# Patient Record
Sex: Male | Born: 1945 | Race: White | Hispanic: No | Smoking: Never smoker
Health system: Southern US, Community
[De-identification: ages and names within clinical notes are randomized; demographics above are authoritative.]

## PROBLEM LIST (undated history)

## (undated) DIAGNOSIS — R918 Other nonspecific abnormal finding of lung field: Secondary | ICD-10-CM

## (undated) DIAGNOSIS — R55 Syncope and collapse: Secondary | ICD-10-CM

## (undated) DIAGNOSIS — E039 Hypothyroidism, unspecified: Secondary | ICD-10-CM

## (undated) DIAGNOSIS — I509 Heart failure, unspecified: Secondary | ICD-10-CM

## (undated) DIAGNOSIS — G4720 Circadian rhythm sleep disorder, unspecified type: Secondary | ICD-10-CM

## (undated) DIAGNOSIS — M1990 Unspecified osteoarthritis, unspecified site: Secondary | ICD-10-CM

## (undated) DIAGNOSIS — G5601 Carpal tunnel syndrome, right upper limb: Secondary | ICD-10-CM

## (undated) DIAGNOSIS — G5620 Lesion of ulnar nerve, unspecified upper limb: Secondary | ICD-10-CM

## (undated) DIAGNOSIS — E785 Hyperlipidemia, unspecified: Secondary | ICD-10-CM

## (undated) DIAGNOSIS — K5790 Diverticulosis of intestine, part unspecified, without perforation or abscess without bleeding: Secondary | ICD-10-CM

## (undated) DIAGNOSIS — T7840XA Allergy, unspecified, initial encounter: Secondary | ICD-10-CM

## (undated) DIAGNOSIS — M4300 Spondylolysis, site unspecified: Secondary | ICD-10-CM

## (undated) DIAGNOSIS — C801 Malignant (primary) neoplasm, unspecified: Secondary | ICD-10-CM

## (undated) HISTORY — DX: Other nonspecific abnormal finding of lung field: R91.8

## (undated) HISTORY — DX: Hyperlipidemia, unspecified: E78.5

## (undated) HISTORY — DX: Diverticulosis of intestine, part unspecified, without perforation or abscess without bleeding: K57.90

## (undated) HISTORY — DX: Spondylolysis, site unspecified: M43.00

## (undated) HISTORY — DX: Lesion of ulnar nerve, unspecified upper limb: G56.20

## (undated) HISTORY — DX: Allergy, unspecified, initial encounter: T78.40XA

## (undated) HISTORY — DX: Malignant (primary) neoplasm, unspecified: C80.1

## (undated) HISTORY — DX: Hypothyroidism, unspecified: E03.9

## (undated) HISTORY — PX: UMBILICAL HERNIA REPAIR: SHX196

## (undated) HISTORY — DX: Heart failure, unspecified: I50.9

## (undated) HISTORY — DX: Circadian rhythm sleep disorder, unspecified type: G47.20

## (undated) HISTORY — DX: Carpal tunnel syndrome, right upper limb: G56.01

## (undated) HISTORY — PX: CARDIAC ELECTROPHYSIOLOGY STUDY AND ABLATION: SHX1294

## (undated) HISTORY — PX: KNEE SURGERY: SHX244

## (undated) HISTORY — DX: Unspecified osteoarthritis, unspecified site: M19.90

## (undated) HISTORY — DX: Syncope and collapse: R55

---

## 2005-09-23 ENCOUNTER — Ambulatory Visit (HOSPITAL_COMMUNITY): Admission: RE | Admit: 2005-09-23 | Discharge: 2005-09-23 | Payer: Self-pay | Admitting: Family Medicine

## 2005-10-19 ENCOUNTER — Ambulatory Visit: Payer: Self-pay | Admitting: Cardiology

## 2005-11-17 ENCOUNTER — Encounter: Payer: Self-pay | Admitting: Cardiology

## 2005-11-17 ENCOUNTER — Ambulatory Visit: Payer: Self-pay

## 2007-06-05 ENCOUNTER — Ambulatory Visit: Payer: Self-pay | Admitting: Internal Medicine

## 2007-06-12 ENCOUNTER — Ambulatory Visit: Payer: Self-pay | Admitting: Internal Medicine

## 2012-03-13 ENCOUNTER — Encounter: Payer: Self-pay | Admitting: Cardiology

## 2012-03-14 ENCOUNTER — Encounter: Payer: Self-pay | Admitting: Cardiology

## 2012-04-16 ENCOUNTER — Encounter: Payer: Self-pay | Admitting: Internal Medicine

## 2012-11-02 ENCOUNTER — Encounter: Payer: Self-pay | Admitting: Cardiology

## 2013-01-23 ENCOUNTER — Encounter: Payer: Self-pay | Admitting: Internal Medicine

## 2013-01-28 ENCOUNTER — Other Ambulatory Visit (INDEPENDENT_AMBULATORY_CARE_PROVIDER_SITE_OTHER): Payer: BC Managed Care – PPO

## 2013-01-28 DIAGNOSIS — C61 Malignant neoplasm of prostate: Secondary | ICD-10-CM

## 2013-01-28 NOTE — Progress Notes (Signed)
Patient here today for labs only. Labs were ordered by dr. Tasia Catchings hall.

## 2013-01-30 NOTE — Progress Notes (Signed)
Pt notified of result

## 2013-01-31 ENCOUNTER — Telehealth: Payer: Self-pay | Admitting: *Deleted

## 2013-01-31 ENCOUNTER — Encounter: Payer: Self-pay | Admitting: *Deleted

## 2013-01-31 NOTE — Telephone Encounter (Signed)
Patient is interesting in obtaining longterm care insurance.  He was denied in the past because his records stated that he had a tremor.  The insurance company was concerned because a tremor may indicate Parkinson's.  Patient states that Parkinson's was ruled out.  He would like for his medical record to reflect that distinction so that he may apply for longterm care insurance again.

## 2013-01-31 NOTE — Telephone Encounter (Signed)
[name], discuss this with me . I know that he does not have Parkinsons. He has seen a neurologist and has not had to return.

## 2013-01-31 NOTE — Telephone Encounter (Signed)
Kay,I sent [name] a note about Benjamin Maxwell and his diagnosis with tremors. We will need to review his chart from the past and seen in confirming evidence that he does not have Parkinson's disease. I do not remember the details, but once I have the chart information we can compose a letter to see into his insurance Company.

## 2013-02-04 ENCOUNTER — Encounter: Payer: Self-pay | Admitting: Family Medicine

## 2013-02-05 ENCOUNTER — Encounter: Payer: Self-pay | Admitting: Family Medicine

## 2013-02-05 NOTE — Telephone Encounter (Signed)
Letter written and given to Dr. Christell Constant for review and correction.

## 2013-02-05 NOTE — Telephone Encounter (Signed)
Letter signed and placed upfront for pickup.  Left message for patient on his voicemail.

## 2013-03-13 ENCOUNTER — Other Ambulatory Visit: Payer: Self-pay | Admitting: *Deleted

## 2013-03-13 MED ORDER — LEVOTHYROXINE SODIUM 50 MCG PO TABS: 50.0000 ug | ORAL_TABLET | Freq: Every day | ORAL | Status: DC

## 2013-03-13 NOTE — Telephone Encounter (Signed)
Pt requesting 3 month supply, last TSH 05/13

## 2013-03-21 ENCOUNTER — Other Ambulatory Visit: Payer: Self-pay | Admitting: *Deleted

## 2013-03-21 MED ORDER — ESZOPICLONE 3 MG PO TABS: 3.0000 mg | ORAL_TABLET | Freq: Every day | ORAL | Status: DC

## 2013-03-21 NOTE — Telephone Encounter (Signed)
Rx called into [facility] Pt notified

## 2013-03-21 NOTE — Telephone Encounter (Signed)
Not sure when last filled, if approved have kay call into [facility] at [phone]

## 2013-04-11 ENCOUNTER — Ambulatory Visit: Payer: Self-pay | Admitting: Neurology

## 2013-04-13 ENCOUNTER — Other Ambulatory Visit: Payer: Self-pay | Admitting: Neurology

## 2013-04-30 ENCOUNTER — Encounter: Payer: Self-pay | Admitting: Neurology

## 2013-04-30 ENCOUNTER — Ambulatory Visit (INDEPENDENT_AMBULATORY_CARE_PROVIDER_SITE_OTHER): Payer: BC Managed Care – PPO | Admitting: Neurology

## 2013-04-30 VITALS — BP 142/88 | HR 72 | Resp 19 | Ht 71.0 in | Wt 232.0 lb

## 2013-04-30 DIAGNOSIS — G4750 Parasomnia, unspecified: Secondary | ICD-10-CM

## 2013-04-30 DIAGNOSIS — G250 Essential tremor: Secondary | ICD-10-CM

## 2013-04-30 DIAGNOSIS — G4720 Circadian rhythm sleep disorder, unspecified type: Secondary | ICD-10-CM | POA: Insufficient documentation

## 2013-04-30 MED ORDER — QUETIAPINE FUMARATE 50 MG PO TABS: 50.0000 mg | ORAL_TABLET | Freq: Every day | ORAL | Status: DC

## 2013-04-30 MED ORDER — CLONAZEPAM 0.25 MG PO TBDP: 0.2500 mg | ORAL_TABLET | Freq: Two times a day (BID) | ORAL | Status: DC | PRN

## 2013-04-30 NOTE — Progress Notes (Signed)
[facility]  Provider:  Dr Angelli Baruch Referring Provider: Ernestina Penna, MD Primary Care Physician:  Rudi Heap, MD  Chief Complaint  Patient presents with  . Neurologic Problem    Sleep...RM#10    HPI:  Benjamin Maxwell is a 67 y.o. male here as a referral from Dr. Christell Constant for  follow up on Parasomnia,  treated with Klonopin. The patient has been followed here for close to 8 years. He is an active traveler and has just traveled again through [place] and [place], [place]  and [place] . 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 As past medical history I need to add  that he had altered to thickness and 2013 in [place], when he reached the 5000 m range. His  insomnia in high altitude  Is probably related to central apnea .  Fss at  32 , [place] at 6 points.   The patient's bedtime is around 10 PM he arises at about 6:30 in the morning, he break spontaneously and does not need an alarm. We can speak days a fairly similar in sleep times he may have one bathroom break at night. There is no history of falls at night of dizziness or light headedness..        Review of Systems: Out of a complete 14 system review, the patient complains of only the following symptoms, and all other reviewed systems are negative. parasomnia   History   Social History  . Marital Status: Divorced    Spouse Name: N/A    Number of Children: N/A  . Years of Education: college   Occupational History  . 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 Smoking status: Never Smoker   . Smokeless tobacco: Never Used  . Alcohol Use: Yes     Comment: Social  . Drug Use: No  . Sexually Active: Not on file   Other Topics Concern  . Not on file   Social History Narrative   Patient is a Art therapist, works full time at [employer]. Patient has a college education.   Patient is divorced.    Family History  Problem Relation Age of Onset  . Heart  block Mother   . Pneumonia Father   . Cancer Father   . Heart disease Mother     Past Medical History  Diagnosis Date  . Syncope and collapse   . 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 Knee surgery Bilateral   . Fetal surgery for congenital hernia      Current Outpatient Prescriptions  Medication Sig Dispense Refill  . Eszopiclone 3 MG TABS Take 3 mg by mouth as needed. Take immediately before bedtime      . levothyroxine (SYNTHROID, LEVOTHROID) 50 MCG tablet Take 1 tablet (50 mcg total) by mouth daily.  90 tablet  0  . QUEtiapine (SEROQUEL) 25 MG tablet TAKE 1 TO 2 TABLETS BY MOUTH AT BEDTIME  60 tablet  0   No current facility-administered medications for this visit.    Allergies as of 04/30/2013  . (No Known Allergies)    Vitals: BP 142/88  Pulse 72  Resp 19  Ht 5\' 11"  (1.803 m)  Wt 232 lb (105.235 kg)  BMI 32.37 kg/m2 Last Weight:  Wt Readings from Last 1 Encounters:  04/30/13 232 lb (105.235 kg)   Last Height:  Ht Readings from Last 1 Encounters:  04/30/13 5\' 11"  (1.803 m)   Vision Screening:  Left eye with correction .  Right eye with correction 20/20.  Physical exam:  General: The patient is awake, alert and appears not in acute distress. The patient is well groomed. Head: Normocephalic, atraumatic. Neck is supple. Mallampati 2, neck circumference: 16.5  Cardiovascular:  Regular rate and rhythm, without  murmurs or carotid bruit, and without distended neck veins. Respiratory: Lungs are clear to auscultation. Skin:  Without evidence of edema, or rash Trunk: BMI is elevated , and patient  has normal posture.  Neurologic exam : The patient is awake and alert, oriented to place and time.  Memory subjective  described as intact. There is a normal attention span & concentration ability. Speech is fluent without  dysarthria, dysphonia or aphasia. Mood and affect are  appropriate.  Cranial nerves: Pupils are equal and briskly reactive to light. Funduscopic exam without  evidence of pallor or edema. Extraocular movements  in vertical and horizontal planes intact and without nystagmus. Visual fields by finger perimetry are intact. Hearing to finger rub intact.  Facial sensation intact to fine touch. Facial motor strength is symmetric and tongue and uvula move midline.  Motor exam: Normal tone and normal muscle bulk and symmetric normal strength in all extremities.  Sensory:  Fine touch, pinprick and vibration were tested in all extremities. Proprioception is  normal.  Coordination: Rapid alternating movements in the fingers/hands is tested and normal. Finger-to-nose normal without evidence of ataxia, dysmetria or tremor.  Gait and station: Patient walks without assistive device  Strength within normal limits. Stance is stable and normal. Tandem gait is unfragmented. Romberg testing is normal.  Deep tendon reflexes: in the  upper and lower extremities are symmetric and intact. Babinski maneuver response is  downgoing.   Assessment:  After physical and neurologic examination, review of laboratory studies, and pre-existing records, assessment will be reviewed on the problem list.  Tremor developed over the last 4 years , mild, and may be essential or Seroquel related, no parkinsonian changes to tone and posture.   Plan:  Treatment plan and additional workup will be reviewed under Problem List.  Refilled Seroquel .  Klonopin is prescribed by Dr Christell Constant.  He is prn using Lunesta.

## 2013-04-30 NOTE — Patient Instructions (Signed)
Insomnia Insomnia means you have trouble falling or staying asleep. It affects about one person in three at different times and is usually related to stress from work, school, or personal relations. Insomnia is also a sign of depression or anxiety. Other medical problems that cause insomnia include conditions that cause pain, night leg cramps, coughing, shortness of breath, urinary problems, and fevers. Sleep apnea is an abnormal breathing pattern at night that can cause insomnia and loud snoring. Certain medications and excess intake of caffeine drinks (coffee, tea, colas) can also interfere with normal sleep. Treatment for insomnia depends on the cause. Besides specific medical treatment, the following measures can help you relax and get better sleep. Get regular exercise every day, at least several hours before bed time. Try to get to bed at the same time every night. Take a hot bath before retiring to help you relax. Do not stay in bed if you are unable to sleep. During the daytime avoid staying in bed to watch television, eat, or read. Reduce unwanted noise and light in your room. Keep your room at a comfortable temperature. Avoid alcohol as it causes one to sleep less soundly, may cause you to awaken during the night, and can leave you feeling groggy the next day. Using a mild sedative prescribed or suggested by your caregiver may be needed, but the daily use of sleeping pills is not recommended. Anti-depressant medicines can improve sleep in people with depression. Please call your doctor for follow up care to better understand the cause and proper treatment of your insomnia. Document Released: 11/10/2004 Document Revised: 12/26/2011 Document Reviewed: 10/03/2005 ExitCare Patient Information 2014 [employer].  

## 2013-05-01 ENCOUNTER — Ambulatory Visit: Payer: Self-pay | Admitting: Family Medicine

## 2013-06-08 ENCOUNTER — Other Ambulatory Visit: Payer: Self-pay | Admitting: Family Medicine

## 2013-06-10 NOTE — Telephone Encounter (Signed)
[facility] 5/13. NTBS

## 2013-08-29 ENCOUNTER — Other Ambulatory Visit (INDEPENDENT_AMBULATORY_CARE_PROVIDER_SITE_OTHER): Payer: BC Managed Care – PPO

## 2013-08-29 ENCOUNTER — Telehealth: Payer: Self-pay | Admitting: *Deleted

## 2013-08-29 DIAGNOSIS — Z139 Encounter for screening, unspecified: Secondary | ICD-10-CM

## 2013-08-29 DIAGNOSIS — E039 Hypothyroidism, unspecified: Secondary | ICD-10-CM

## 2013-08-29 DIAGNOSIS — E785 Hyperlipidemia, unspecified: Secondary | ICD-10-CM

## 2013-08-29 NOTE — Telephone Encounter (Signed)
Pt going on [employer]  Needs form filled out for cruise Needs blood type for form Pt notified Order entered

## 2013-08-29 NOTE — Progress Notes (Signed)
Patient came in for labs only.

## 2013-08-30 LAB — ABO/RH: Rh Factor: POSITIVE

## 2013-09-04 NOTE — Progress Notes (Signed)
Pt came into clinic and was given this result

## 2013-09-09 ENCOUNTER — Other Ambulatory Visit: Payer: Self-pay | Admitting: Family Medicine

## 2013-10-03 ENCOUNTER — Ambulatory Visit (INDEPENDENT_AMBULATORY_CARE_PROVIDER_SITE_OTHER): Payer: Medicare Other | Admitting: Family Medicine

## 2013-10-03 ENCOUNTER — Encounter: Payer: Self-pay | Admitting: Family Medicine

## 2013-10-03 VITALS — BP 135/78 | HR 61 | Temp 97.4°F | Ht 71.0 in | Wt 224.0 lb

## 2013-10-03 DIAGNOSIS — Z8546 Personal history of malignant neoplasm of prostate: Secondary | ICD-10-CM | POA: Insufficient documentation

## 2013-10-03 DIAGNOSIS — Z566 Other physical and mental strain related to work: Secondary | ICD-10-CM

## 2013-10-03 DIAGNOSIS — Z23 Encounter for immunization: Secondary | ICD-10-CM

## 2013-10-03 DIAGNOSIS — Z569 Unspecified problems related to employment: Secondary | ICD-10-CM

## 2013-10-03 DIAGNOSIS — G4700 Insomnia, unspecified: Secondary | ICD-10-CM | POA: Insufficient documentation

## 2013-10-03 DIAGNOSIS — E039 Hypothyroidism, unspecified: Secondary | ICD-10-CM | POA: Insufficient documentation

## 2013-10-03 DIAGNOSIS — E785 Hyperlipidemia, unspecified: Secondary | ICD-10-CM | POA: Insufficient documentation

## 2013-10-03 DIAGNOSIS — E559 Vitamin D deficiency, unspecified: Secondary | ICD-10-CM

## 2013-10-03 LAB — POCT CBC
Granulocyte percent: 59.4 %G (ref 37–80)
HCT, POC: 47.7 % (ref 43.5–53.7)
Hemoglobin: 15.3 g/dL (ref 14.1–18.1)
MCHC: 32.1 g/dL (ref 31.8–35.4)
MPV: 9.9 fL (ref 0–99.8)
POC Granulocyte: 5.1 (ref 2–6.9)
POC LYMPH PERCENT: 37.7 %L (ref 10–50)

## 2013-10-03 MED ORDER — SCOPOLAMINE 1 MG/3DAYS TD PT72: 1.0000 | MEDICATED_PATCH | TRANSDERMAL | Status: DC

## 2013-10-03 NOTE — Progress Notes (Signed)
Subjective:    Patient ID: Benjamin Maxwell, male    DOB: Jan 18, 1946, 67 y.o.   MRN: [id number]  HPI Pt here for follow up and management of chronic medical problems. The patient is planning a cruise [place] and he would like something for nausea while on the ship. We have suggested that we will call in some Transderm scopolamine for this. We also reviewed his need for routine health maintenance issues and one of these days is to get a stress test done. He denies any chest pain but says sometimes he does have stress and tightness at work. He also needs to get an eye exam.      Patient Active Problem List   Diagnosis Date Noted  . 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 clonazePAM (KLONOPIN) 0.25 MG disintegrating tablet Take 1 tablet (0.25 mg total) by mouth 2 (two) times daily as needed.  . Eszopiclone 3 MG TABS Take 3 mg by mouth as needed. Take immediately before bedtime  . levothyroxine (SYNTHROID, LEVOTHROID) 50 MCG tablet TAKE 1 TABLET (50 MCG TOTAL) BY MOUTH DAILY.  Marland Kitchen QUEtiapine (SEROQUEL) 50 MG tablet Take 1 tablet (50 mg total) by mouth at bedtime.    Review of Systems  Constitutional: Negative.   HENT: Negative.   Eyes: Negative.   Respiratory: Negative.   Cardiovascular: Negative.   Gastrointestinal: Negative.   Endocrine: Negative.   Genitourinary: Negative.   Musculoskeletal: Negative.   Skin: Negative.   Allergic/Immunologic: Negative.   Neurological: Negative.   Hematological: Negative.   Psychiatric/Behavioral: Negative.        Objective:   Physical Exam  Nursing note and vitals reviewed. Constitutional: He is oriented to person, place, and time. He appears well-developed and well-nourished. No distress.  HENT:  Head: Normocephalic and atraumatic.  Right Ear: External ear normal.  Left Ear: External ear normal.  Nose: Nose normal.  Mouth/Throat:  Oropharynx is clear and moist. No oropharyngeal exudate.  Eyes: Conjunctivae and EOM are normal. Pupils are equal, round, and reactive to light. Right eye exhibits no discharge. Left eye exhibits no discharge. No scleral icterus.  Neck: Normal range of motion. Neck supple. No tracheal deviation present. No thyromegaly present.  Cardiovascular: Normal rate, regular rhythm, normal heart sounds and intact distal pulses.  Exam reveals no gallop and no friction rub.   No murmur heard. At 72 per minute  Pulmonary/Chest: Effort normal and breath sounds normal. No respiratory distress. He has no wheezes. He has no rales. He exhibits no tenderness.  Abdominal: Soft. Bowel sounds are normal. He exhibits no mass. There is no tenderness. There is no rebound and no guarding.  There is obesity  Musculoskeletal: Normal range of motion. He exhibits no edema and no tenderness.  Lymphadenopathy:    He has no cervical adenopathy.  Neurological: He is alert and oriented to person, place, and time. He has normal reflexes. No cranial nerve deficit.  Skin: Skin is warm and dry. No rash noted. No erythema. No pallor.  Psychiatric: He has a normal mood and affect. His behavior is normal. Judgment and thought content normal.   BP 135/78  Pulse 61  Temp(Src) 97.4 F (36.3 C) (Oral)  Ht 5\' 11"  (1.803 m)  Wt 224 lb (101.606 kg)  BMI 31.26 kg/m2        Assessment & Plan:   1. Hyperlipidemia - POCT CBC - BMP8+EGFR - Hepatic function  panel - NMR, lipoprofile - Ambulatory referral to Cardiology  2. Vitamin D deficiency - POCT CBC - Vit D  25 hydroxy (rtn osteoporosis monitoring)  3. Insomnia  4. History of prostate cancer  5. Hypothyroidism  6. Stress at work - Ambulatory referral to Cardiology  Orders Placed This Encounter  Procedures  . Pneumococcal conjugate vaccine 13-valent  . BMP8+EGFR  . Hepatic function panel  . NMR, lipoprofile  . Vit D  25 hydroxy (rtn osteoporosis monitoring)  .  Ambulatory referral to Cardiology    Referral Priority:  Routine    Referral Type:  Consultation    Referral Reason:  Specialty Services Required    Requested Specialty:  Cardiology    Number of Visits Requested:  1  . POCT CBC   Meds ordered this encounter  Medications  . scopolamine (TRANSDERM-SCOP) 1.5 MG    Sig: Place 1 patch (1.5 mg total) onto the skin every 3 (three) days.    Dispense:  10 patch    Refill:  12   Patient Instructions  Continue current medications. Continue good therapeutic lifestyle changes which include good diet and exercise. Fall precautions discussed with patient. Schedule your flu vaccine if you haven't had it yet If you are over 89 years old - you may need Prevnar 13 or the adult Pneumonia vaccine. Continue to do the best possible regular diet and efforts to lose weight through aggressive exercise and dieting Watch your sodium intake. Watch your carbohydrate intake.  We will call in a prescription today for nausea while you're on this trip to [place]  We will also arrange a stress test  with the cardiologist   Nyra Capes MD

## 2013-10-03 NOTE — Patient Instructions (Addendum)
Continue current medications. Continue good therapeutic lifestyle changes which include good diet and exercise. Fall precautions discussed with patient. Schedule your flu vaccine if you haven't had it yet If you are over 65 years old - you may need Prevnar 13 or the adult Pneumonia vaccine. Continue to do the best possible regular diet and efforts to lose weight through aggressive exercise and dieting Watch your sodium intake. Watch your carbohydrate intake.  We will call in a prescription today for nausea while you're on this trip to [place]  We will also arrange a stress test  with the cardiologist

## 2013-10-05 LAB — BMP8+EGFR
BUN: 11 mg/dL (ref 8–27)
Calcium: 9.7 mg/dL (ref 8.6–10.2)
Chloride: 98 mmol/L (ref 97–108)
GFR calc Af Amer: 95 mL/min/{1.73_m2} (ref >59)
GFR calc non Af Amer: 83 mL/min/{1.73_m2} (ref >59)
Glucose: 99 mg/dL (ref 65–99)
Potassium: 4.3 mmol/L (ref 3.5–5.2)

## 2013-10-05 LAB — NMR, LIPOPROFILE
Cholesterol: 214 mg/dL — ABNORMAL HIGH (ref <200)
HDL Particle Number: 38.7 umol/L (ref >=30.5)
LDL Particle Number: 1352 nmol/L — ABNORMAL HIGH (ref <1000)
LDLC SERPL CALC-MCNC: 123 mg/dL — ABNORMAL HIGH (ref <100)
Triglycerides by NMR: 123 mg/dL (ref <150)

## 2013-10-05 LAB — HEPATIC FUNCTION PANEL
Albumin: 4.6 g/dL (ref 3.6–4.8)
Bilirubin, Direct: 0.21 mg/dL (ref 0.00–0.40)
Total Protein: 7.5 g/dL (ref 6.0–8.5)

## 2013-10-07 ENCOUNTER — Other Ambulatory Visit (INDEPENDENT_AMBULATORY_CARE_PROVIDER_SITE_OTHER): Payer: Medicare Other

## 2013-10-07 DIAGNOSIS — Z1212 Encounter for screening for malignant neoplasm of rectum: Secondary | ICD-10-CM

## 2013-10-07 NOTE — Progress Notes (Signed)
Pt came in for labs only 

## 2013-10-08 LAB — SPECIMEN STATUS REPORT

## 2013-10-15 ENCOUNTER — Telehealth: Payer: Self-pay | Admitting: Family Medicine

## 2013-10-15 NOTE — Telephone Encounter (Signed)
Message copied by Azalee Course on Tue Oct 15, 2013 11:22 AM ------      Message from: Ernestina Penna      Created: Sat Oct 05, 2013  9:26 PM       The blood sugar renal function and electrolytes were all within normal limits      All liver function tests are within normal limit      Advanced lipid testing, the total LDL particle number is elevated at 1352, the LDL C. is elevated at 123. Triglycerides are slightly elevated at 123 the LDL size is large. Please schedule the patient a visit with the clinical pharmacist so that he can get back on track with his diet and cholesterol+++++++++      The vitamin D level is good at 63.4. Continue current treatment ------

## 2013-10-16 ENCOUNTER — Encounter: Payer: Self-pay | Admitting: *Deleted

## 2013-10-16 NOTE — Telephone Encounter (Signed)
PATIENT AWARE OF LISTED LABS. APPT MADE WITH MICHELLE

## 2013-10-18 ENCOUNTER — Encounter: Payer: Self-pay | Admitting: *Deleted

## 2013-10-28 ENCOUNTER — Encounter: Payer: Self-pay | Admitting: *Deleted

## 2013-10-28 NOTE — Progress Notes (Signed)
Quick Note:  Copy of labs sent to patient ______ 

## 2013-10-29 ENCOUNTER — Ambulatory Visit (INDEPENDENT_AMBULATORY_CARE_PROVIDER_SITE_OTHER): Payer: Medicare Other | Admitting: Pharmacist Clinician (PhC)/ Clinical Pharmacy Specialist

## 2013-10-29 ENCOUNTER — Encounter: Payer: Self-pay | Admitting: Pharmacist Clinician (PhC)/ Clinical Pharmacy Specialist

## 2013-10-29 DIAGNOSIS — E785 Hyperlipidemia, unspecified: Secondary | ICD-10-CM

## 2013-10-29 NOTE — Progress Notes (Signed)
Benjamin Maxwell is a 68 year old male who is referred by Dr. Laurance Flatten for a lipid consultation.  Patient does not have a personal or family history positive for heart disease or diabetes or stroke.  He has a modest elevation in his LDL-C and LDL-P which may be contributed to by quetiapine.  He has not been following a diet or exercise regimen and would like to lose 25lbs.  I reviewed with patient how to establish a successful exercise routine.  ie get a book on tape and listed to it for 30 minutes each day while walking and only listen to the book while exercising so he has something to look forward to.  Trying to find an exercise partner to hold him accountable.  I reviewed his current diet and make suggestions and then also made a diet meal plan for him to follow.  Patient is very motivated to lose weight and we discussed the multiple health benefits he would see.

## 2013-11-06 ENCOUNTER — Encounter: Payer: Self-pay | Admitting: Cardiology

## 2013-11-06 ENCOUNTER — Ambulatory Visit (INDEPENDENT_AMBULATORY_CARE_PROVIDER_SITE_OTHER): Payer: Medicare Other | Admitting: Cardiology

## 2013-11-06 VITALS — BP 130/78 | HR 67 | Ht 71.0 in | Wt 230.0 lb

## 2013-11-06 DIAGNOSIS — M25519 Pain in unspecified shoulder: Secondary | ICD-10-CM

## 2013-11-06 NOTE — Progress Notes (Signed)
HPI The patient has no prior cardiac history. He is sent for evaluation of shoulder discomfort. He gets this sporadically with stress at work.  He can do some activities such as raking leaves without bringing this on. The discomfort is in his left shoulder. A sharp. It can be 7/10 associated with diaphoresis and lightheadedness. It lasts for several minutes. It goes away on its own. It is not positional or worse with breathing. He doesn't take anything to try to get rid of it. This has been going on for months or years and he thinks it's a stable pattern. He denies any resting complaints such as shortness of breath, PND or orthopnea. He doesn't report any palpitations, presyncope or syncope.   Allergies  Allergen Reactions  . Levitra [Vardenafil]     Current Outpatient Prescriptions  Medication Sig Dispense Refill  . clonazePAM (KLONOPIN) 0.25 MG disintegrating tablet Take 1 tablet (0.25 mg total) by mouth 2 (two) times daily as needed.  90 tablet  3  . Eszopiclone 3 MG TABS Take 3 mg by mouth as needed. Take immediately before bedtime      . levofloxacin (LEVAQUIN) 500 MG tablet       . levothyroxine (SYNTHROID, LEVOTHROID) 50 MCG tablet TAKE 1 TABLET (50 MCG TOTAL) BY MOUTH DAILY.  90 tablet  0  . QUEtiapine (SEROQUEL) 50 MG tablet Take 1 tablet (50 mg total) by mouth at bedtime.  90 tablet  3  . scopolamine (TRANSDERM-SCOP) 1.5 MG Place 1 patch (1.5 mg total) onto the skin every 3 (three) days.  10 patch  12   No current facility-administered medications for this visit.    Past Medical History  Diagnosis Date  . Syncope and collapse   . 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 Hyperlipidemia   . Thyroid disease   . Pulmonary nodules   . Diverticulosis   . Spondylolysis   . Cancer     Prostate, followed by urology    Past Surgical History  Procedure Laterality Date  . Knee surgery Bilateral   . Fetal surgery for congenital hernia      Family  History  Problem Relation Age of Onset  . Heart block Mother   . Pneumonia Father   . Cancer Father   . Heart disease Mother     History   Social History  . Marital Status: Divorced    Spouse Name: N/A    Number of Children: N/A  . Years of Education: college   Occupational History  . 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 Smoking status: Never Smoker   . Smokeless tobacco: Never Used  . Alcohol Use: Yes     Comment: Social  . Drug Use: No  . Sexual Activity: Not on file   Other Topics Concern  . Not on file   Social History Narrative   Patient is a Tree surgeon, works full time at [employer]. Patient has a college education.   Patient is divorced.    ROS:  Positive for headaches ,ED, joint pains. Otherwise as stated in the HPI and negative for all other systems.  PHYSICAL EXAM BP 130/78  Pulse 67  Ht 5\' 11"  (1.803 m)  Wt 230 lb (104.327 kg)  BMI 32.09 kg/m2 GENERAL:  Well appearing HEENT:  Pupils equal round and reactive, fundi not visualized, oral mucosa unremarkable NECK:  No jugular venous distention, waveform within normal limits, carotid  upstroke brisk and symmetric, no bruits, no thyromegaly LYMPHATICS:  No cervical, inguinal adenopathy LUNGS:  Clear to auscultation bilaterally BACK:  No CVA tenderness CHEST:  Unremarkable HEART:  PMI not displaced or sustained,S1 and S2 within normal limits, no S3, no S4, no clicks, no rubs, no murmurs ABD:  Flat, positive bowel sounds normal in frequency in pitch, no bruits, no rebound, no guarding, no midline pulsatile mass, no hepatomegaly, no splenomegaly EXT:  2 plus pulses throughout, no edema, no cyanosis no clubbing SKIN:  No rashes no nodules NEURO:  Cranial nerves II through XII grossly intact, motor grossly intact throughout PSYCH:  Cognitively intact, oriented to person place and time   EKG:  Sinus rhythm, rate 67, left axis deviation, RSR prime V1 and V2, no acute ST-T wave changes.  11/06/2013  ASSESSMENT AND PLAN  SHOULDER PAIN:  There are some atypical features to this but it has been a long-standing somewhat stable problem. I will bring the patient back for a POET (Plain Old Exercise Test). This will allow me to screen for obstructive coronary disease, risk stratify and very importantly provide a prescription for exercise.  DYSLIPIDEMIA:  This is followed closely by Dr. Laurance Flatten. I will defer to his expertise.

## 2013-11-06 NOTE — Patient Instructions (Signed)
The current medical regimen is effective;  continue present plan and medications.  Your physician has requested that you have an exercise tolerance test. For further information please visit [email or web address]. Please also follow instruction sheet, as given.  Further follow up will be based on these results 

## 2013-11-12 ENCOUNTER — Ambulatory Visit (HOSPITAL_COMMUNITY)
Admission: RE | Admit: 2013-11-12 | Discharge: 2013-11-12 | Disposition: A | Discharge status: Home / Self Care | Payer: Medicare Other | Source: Ambulatory Visit | Attending: Cardiology | Admitting: Cardiology

## 2013-11-12 ENCOUNTER — Telehealth: Payer: Self-pay | Admitting: *Deleted

## 2013-11-12 DIAGNOSIS — M25519 Pain in unspecified shoulder: Secondary | ICD-10-CM | POA: Insufficient documentation

## 2013-11-12 NOTE — Telephone Encounter (Signed)
Per Dr Percival Spanish - No evidence of ischemia. No further work up.    Pt aware of results

## 2013-12-07 ENCOUNTER — Other Ambulatory Visit: Payer: Self-pay | Admitting: Family Medicine

## 2014-01-02 ENCOUNTER — Telehealth: Payer: Self-pay | Admitting: Family Medicine

## 2014-01-02 NOTE — Telephone Encounter (Signed)
Patient last seen in office on 10-03-13. Rx last filled on 08-28-13. Please advise. If approved please route to Pool A so nurse can phone in to pharmacy

## 2014-01-03 ENCOUNTER — Other Ambulatory Visit: Payer: Self-pay | Admitting: Family Medicine

## 2014-01-06 ENCOUNTER — Other Ambulatory Visit: Payer: Self-pay | Admitting: Family Medicine

## 2014-01-06 NOTE — Telephone Encounter (Signed)
Called in.

## 2014-01-09 ENCOUNTER — Other Ambulatory Visit: Payer: Self-pay | Admitting: Neurology

## 2014-01-09 NOTE — Telephone Encounter (Signed)
Rx signed and faxed.

## 2014-02-16 ENCOUNTER — Other Ambulatory Visit: Payer: Self-pay | Admitting: Family Medicine

## 2014-02-18 NOTE — Telephone Encounter (Signed)
No thyroid labs in last year. Please advise on refill

## 2014-02-18 NOTE — Telephone Encounter (Signed)
Please have patient come in and get a thyroid profile and we will refill this medication for one year

## 2014-04-17 ENCOUNTER — Other Ambulatory Visit (HOSPITAL_COMMUNITY): Payer: Self-pay | Admitting: Urology

## 2014-04-17 DIAGNOSIS — C61 Malignant neoplasm of prostate: Secondary | ICD-10-CM

## 2014-04-20 ENCOUNTER — Other Ambulatory Visit: Payer: Self-pay | Admitting: Neurology

## 2014-04-21 ENCOUNTER — Ambulatory Visit (INDEPENDENT_AMBULATORY_CARE_PROVIDER_SITE_OTHER): Payer: Medicare Other | Admitting: Family Medicine

## 2014-04-21 ENCOUNTER — Telehealth: Payer: Self-pay

## 2014-04-21 ENCOUNTER — Ambulatory Visit (INDEPENDENT_AMBULATORY_CARE_PROVIDER_SITE_OTHER): Payer: Medicare Other

## 2014-04-21 ENCOUNTER — Encounter: Payer: Self-pay | Admitting: Family Medicine

## 2014-04-21 VITALS — BP 124/72 | HR 73 | Temp 97.1°F | Ht 71.0 in | Wt 217.0 lb

## 2014-04-21 DIAGNOSIS — Z139 Encounter for screening, unspecified: Secondary | ICD-10-CM

## 2014-04-21 DIAGNOSIS — R5383 Other fatigue: Secondary | ICD-10-CM

## 2014-04-21 DIAGNOSIS — E559 Vitamin D deficiency, unspecified: Secondary | ICD-10-CM

## 2014-04-21 DIAGNOSIS — L209 Atopic dermatitis, unspecified: Secondary | ICD-10-CM

## 2014-04-21 DIAGNOSIS — E785 Hyperlipidemia, unspecified: Secondary | ICD-10-CM

## 2014-04-21 DIAGNOSIS — R5381 Other malaise: Secondary | ICD-10-CM

## 2014-04-21 DIAGNOSIS — L2089 Other atopic dermatitis: Secondary | ICD-10-CM

## 2014-04-21 DIAGNOSIS — G4700 Insomnia, unspecified: Secondary | ICD-10-CM

## 2014-04-21 LAB — POCT CBC
Granulocyte percent: 62.6 %G (ref 37–80)
HEMATOCRIT: 47.1 % (ref 43.5–53.7)
HEMOGLOBIN: 14.7 g/dL (ref 14.1–18.1)
LYMPH, POC: 3.4 (ref 0.6–3.4)
MCH: 28.8 pg (ref 27–31.2)
MCHC: 31.3 g/dL — AB (ref 31.8–35.4)
MCV: 92.1 fL (ref 80–97)
MPV: 9.6 fL (ref 0–99.8)
POC LYMPH PERCENT: 35.7 %L (ref 10–50)
Platelet Count, POC: 176 10*3/uL (ref 142–424)
RBC: 5.1 M/uL (ref 4.69–6.13)
RDW, POC: 15.1 %
WBC: 9.6 10*3/uL (ref 4.6–10.2)

## 2014-04-21 MED ORDER — LEVOTHYROXINE SODIUM 50 MCG PO TABS: ORAL_TABLET | ORAL | Status: DC

## 2014-04-21 MED ORDER — ESZOPICLONE 3 MG PO TABS: ORAL_TABLET | ORAL | Status: DC

## 2014-04-21 NOTE — Telephone Encounter (Signed)
Pt aware of CXR results.

## 2014-04-21 NOTE — Telephone Encounter (Signed)
Message copied by Koren Bound on Mon Apr 21, 2014  3:28 PM ------      Message from: Chipper Herb      Created: Mon Apr 21, 2014  1:41 PM       As per radiology report ------

## 2014-04-21 NOTE — Patient Instructions (Addendum)
Continue current medications. Continue good therapeutic lifestyle changes which include good diet and exercise. Fall precautions discussed with patient. If an FOBT was given today- please return it to our front desk. If you are over 66 years old - you may need Prevnar 7 or the adult Pneumonia vaccine.  Use cortisone 10 sparingly twice daily to rash or 7-10 day Use scent free detergents fabric softeners and soap We will call you with the results of the lab work and chest x-ray as soon as those are available

## 2014-04-21 NOTE — Progress Notes (Signed)
Subjective:    Patient ID: Benjamin Maxwell, male    DOB: 07-19-1946, 68 y.o.   MRN: [id number]  HPI Pt here for follow up and management of chronic medical problems. To include hyperlipidemia, hypothyroidism, insomnia and Vit D deficiency.the patient does describe an irritated area of skin on his left side and upper thigh. This is somewhat pruritic. He does not wash his clothes so he is uncertain about the type of detergent that is being used. Other pertinent information includes that he still has prostate cancer and that a biopsy is being planned and an MRI has been scheduled.  Review of Systems  Constitutional: Negative.   HENT: Negative.   Eyes: Negative.   Respiratory: Negative.   Cardiovascular: Negative.   Gastrointestinal: Negative.   Endocrine: Negative.   Genitourinary: Negative.   Musculoskeletal: Negative.   Skin: Positive for color change (Striated area Left side of abdomen).  Allergic/Immunologic: Negative.   Neurological: Negative.   Hematological: Negative.   Psychiatric/Behavioral: Negative.        Objective:   Physical Exam  Nursing note and vitals reviewed. Constitutional: He is oriented to person, place, and time. He appears well-developed and well-nourished. No distress.  HENT:  Head: Normocephalic and atraumatic.  Right Ear: External ear normal.  Left Ear: External ear normal.  Nose: Nose normal.  Mouth/Throat: Oropharynx is clear and moist. No oropharyngeal exudate.  Eyes: Conjunctivae and EOM are normal. Pupils are equal, round, and reactive to light. Right eye exhibits no discharge. Left eye exhibits no discharge. No scleral icterus.  Neck: Normal range of motion. Neck supple. No thyromegaly present.  No carotid bruits  Cardiovascular: Normal rate, regular rhythm, normal heart sounds and intact distal pulses.  Exam reveals no gallop and no friction rub.   No murmur heard. At 72 per minute  Pulmonary/Chest: Effort normal and breath sounds normal. No  respiratory distress. He has no wheezes. He has no rales. He exhibits no tenderness.  Abdominal: Soft. Bowel sounds are normal. He exhibits no mass. There is no tenderness. There is no rebound and no guarding.  Musculoskeletal: Normal range of motion. He exhibits no edema and no tenderness.  Lymphadenopathy:    He has no cervical adenopathy.  Neurological: He is alert and oriented to person, place, and time. He has normal reflexes. No cranial nerve deficit.  Skin: Skin is warm and dry. Rash noted. No erythema. No pallor.  Excoriations of a linear nature left lateral  Psychiatric: He has a normal mood and affect. His behavior is normal. Judgment and thought content normal.   BP 124/72  Pulse 73  Temp(Src) 97.1 F (36.2 C) (Oral)  Ht 5' 11"  (1.803 m)  Wt 217 lb (98.431 kg)  BMI 30.28 kg/m2  [facility] reading (PRIMARY) by  Dr.Teja Costen-chest x-ray-no active disease                                        Assessment & Plan:  1. Hyperlipidemia - BMP8+EGFR - NMR, lipoprofile - Hepatic function panel  2. Vitamin D deficiency - Vit D  25 hydroxy (rtn osteoporosis monitoring)  3. Insomnia  4. Vitamin D deficiency - Vit D  25 hydroxy (rtn osteoporosis monitoring)  5. Other fatigue - POCT CBC  6. Screening - DG Chest 2 View  7. Atopic dermatitis  Meds ordered this encounter  Medications  . levothyroxine (SYNTHROID, LEVOTHROID) 50 MCG tablet  Sig: TAKE 1 TABLET (50 MCG TOTAL) BY MOUTH DAILY.    Dispense:  90 tablet    Refill:  4  . Eszopiclone 3 MG TABS    Sig: TAKE 1 TABLET AT BEDTIME AS NEEDED    Dispense:  30 tablet    Refill:  5   Patient Instructions  Continue current medications. Continue good therapeutic lifestyle changes which include good diet and exercise. Fall precautions discussed with patient. If an FOBT was given today- please return it to our front desk. If you are over 27 years old - you may need Prevnar 24 or the adult Pneumonia vaccine.  Use cortisone 10  sparingly twice daily to rash or 7-10 day Use scent free detergents fabric softeners and soap We will call you with the results of the lab work and chest x-ray as soon as those are available   Arrie Senate MD

## 2014-04-22 LAB — HEPATIC FUNCTION PANEL
ALBUMIN: 4.5 g/dL (ref 3.6–4.8)
ALK PHOS: 58 IU/L (ref 39–117)
ALT: 28 IU/L (ref 0–44)
AST: 45 IU/L — ABNORMAL HIGH (ref 0–40)
BILIRUBIN DIRECT: 0.25 mg/dL (ref 0.00–0.40)
TOTAL PROTEIN: 7.3 g/dL (ref 6.0–8.5)
Total Bilirubin: 1.2 mg/dL (ref 0.0–1.2)

## 2014-04-22 LAB — NMR, LIPOPROFILE
Cholesterol: 210 mg/dL — ABNORMAL HIGH (ref 100–199)
HDL Cholesterol by NMR: 66 mg/dL (ref >39)
HDL PARTICLE NUMBER: 41.5 umol/L (ref >=30.5)
LDL Particle Number: 1093 nmol/L — ABNORMAL HIGH (ref <1000)
LDL Size: 21.6 nm (ref >20.5)
LDLC SERPL CALC-MCNC: 121 mg/dL — AB (ref 0–99)
LP-IR SCORE: 29 (ref <=45)
SMALL LDL PARTICLE NUMBER: 262 nmol/L (ref <=527)
Triglycerides by NMR: 113 mg/dL (ref 0–149)

## 2014-04-22 LAB — BMP8+EGFR
BUN/Creatinine Ratio: 12 (ref 10–22)
BUN: 16 mg/dL (ref 8–27)
CALCIUM: 9.2 mg/dL (ref 8.6–10.2)
CO2: 24 mmol/L (ref 18–29)
Chloride: 98 mmol/L (ref 97–108)
Creatinine, Ser: 1.3 mg/dL — ABNORMAL HIGH (ref 0.76–1.27)
GFR calc Af Amer: 66 mL/min/{1.73_m2} (ref >59)
GFR calc non Af Amer: 57 mL/min/{1.73_m2} — ABNORMAL LOW (ref >59)
Glucose: 82 mg/dL (ref 65–99)
Potassium: 4.1 mmol/L (ref 3.5–5.2)
Sodium: 136 mmol/L (ref 134–144)

## 2014-04-22 LAB — VITAMIN D 25 HYDROXY (VIT D DEFICIENCY, FRACTURES): VIT D 25 HYDROXY: 67.7 ng/mL (ref 30.0–100.0)

## 2014-04-30 ENCOUNTER — Ambulatory Visit (INDEPENDENT_AMBULATORY_CARE_PROVIDER_SITE_OTHER): Payer: Medicare Other | Admitting: Neurology

## 2014-04-30 ENCOUNTER — Encounter: Payer: Self-pay | Admitting: Neurology

## 2014-04-30 VITALS — BP 136/83 | HR 77 | Resp 14 | Ht 71.0 in | Wt 213.0 lb

## 2014-04-30 DIAGNOSIS — G4750 Parasomnia, unspecified: Secondary | ICD-10-CM | POA: Insufficient documentation

## 2014-04-30 MED ORDER — CLONAZEPAM 0.5 MG PO TABS: ORAL_TABLET | ORAL | Status: DC

## 2014-04-30 MED ORDER — QUETIAPINE FUMARATE 50 MG PO TABS: 50.0000 mg | ORAL_TABLET | Freq: Every day | ORAL | Status: DC

## 2014-04-30 NOTE — Progress Notes (Signed)
[facility]  Provider:  Dr Kathlene Yano Referring Provider: Chipper Herb, MD Primary Care Physician:  Redge Gainer, MD  Chief Complaint  Patient presents with  . Follow-up    Room 10  . Parasomnia    HPI:  Benjamin Maxwell is a 68 y.o. male here as a referral from Dr. Laurance Flatten for follow up on Parasomnia,  treated with Klonopin.  There has been no breakthrough activity of parasomnias on Klonopin.   [place] 7 FSS 40 .   The patient has been followed here for close to 9 years. He is an active traveler and has just traveled again through [place] and [place], [place]  and [place] . 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 As past medical history I need to add  that he had altered to thickness and 2013 in [place], when he reached the 5000 m range. His  insomnia in high altitude was probably related to central apnea . The patient's bedtime is around 10 PM he arises at about 6:30 in the morning, he break spontaneously and does not need an alarm. We can speak days a fairly similar in sleep times he may have one bathroom break at night. There is no history of falls at night of dizziness or light headedness..        Review of Systems: Out of a complete 14 system review, the patient complains of only the following symptoms, and all other reviewed systems are negative. parasomnia   History   Social History  . Marital Status: Divorced    Spouse Name: N/A    Number of Children: 2  . Years of Education: college   Occupational History  . 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 Smoking status: Never Smoker   . Smokeless tobacco: Never Used  . Alcohol Use: Yes     Comment: Social  . Drug Use: No  . Sexual Activity: Not on file   Other Topics Concern  . Not on file   Social History Narrative   Patient is a Tree surgeon, works full time at [employer]. Patient has a college education. Patient is divorced.  Lives alone   Patient is right-handed.   Patient drinks very little caffeine (tea).    Family History  Problem Relation Age of Onset  . Atrial fibrillation Mother   . Pneumonia Father   . Cancer Father     Past Medical History  Diagnosis Date  . Syncope and collapse   . 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 Hyperlipidemia   . Hypothyroid   . Pulmonary nodules   . Diverticulosis   . Spondylolysis   . Cancer     Prostate, followed by urology    Past Surgical History  Procedure Laterality Date  . Knee surgery Bilateral   . Umbilical hernia repair      Current Outpatient Prescriptions  Medication Sig Dispense Refill  . clonazePAM (KLONOPIN) 0.25 MG disintegrating tablet TAKE 1 TABLET BY MOUTH TWICE A DAY AS NEEDED  180 tablet  1  . Eszopiclone 3 MG TABS TAKE 1 TABLET AT BEDTIME AS NEEDED  30 tablet  5  . levothyroxine (SYNTHROID, LEVOTHROID) 50 MCG tablet TAKE 1 TABLET (50 MCG TOTAL) BY MOUTH DAILY.  90 tablet  4  . QUEtiapine (SEROQUEL) 50 MG tablet TAKE 1 TABLET (50 MG TOTAL) BY MOUTH AT BEDTIME.  90 tablet  0   No  current facility-administered medications for this visit.    Allergies as of 04/30/2014 - Review Complete 04/30/2014  Allergen Reaction Noted  . Levitra [vardenafil]  10/03/2013    Vitals: BP 136/83  Pulse 77  Resp 14  Ht 5\' 11"  (1.803 m)  Wt 213 lb (96.616 kg)  BMI 29.72 kg/m2 Last Weight:  Wt Readings from Last 1 Encounters:  04/30/14 213 lb (96.616 kg)   Last Height:   Ht Readings from Last 1 Encounters:  04/30/14 5\' 11"  (1.803 m)   Vision Screening:  Left eye with correction.                                    Right eye with correction 20/20.  Physical exam:  General: The patient is awake, alert and appears not in acute distress. The patient is well groomed. Head: Normocephalic, atraumatic. Neck is supple. Mallampati 2, neck circumference: 45.0 , TMJ click on the right.  Cardiovascular:  Regular rate and rhythm, without  murmurs or carotid bruit, and without distended neck veins. Respiratory: Lungs are clear to auscultation. Skin:  Without evidence of edema, or rash Trunk: BMI is elevated , and patient  has normal posture.  Neurologic exam : The patient is awake and alert, oriented to place and time.   Memory subjective  described as intact. There is a normal attention span & concentration ability.  Speech is fluent without  dysarthria, dysphonia or aphasia. Mood and affect are appropriate.  Cranial nerves: Pupils are equal and briskly reactive to light. Funduscopic exam without  evidence of pallor or edema.  Extraocular movements  in vertical and horizontal planes intact and without nystagmus. Visual fields by finger perimetry are intact. Hearing to finger rub intact.  Facial sensation intact to fine touch. Facial motor strength is symmetric and tongue and uvula move midline.  Motor exam: Normal tone and normal muscle bulk and symmetric normal strength in all extremities.  Sensory:  Fine touch, pinprick and vibration were tested in all extremities. Proprioception is  normal.  Coordination: Rapid alternating movements in the fingers/hands is tested and normal.  Finger-to-nose normal without evidence of ataxia, dysmetria or tremor.  Gait and station: Patient walks without assistive device  Strength within normal limits.  Stance is stable and normal.  Tandem gait is unfragmented. Romberg testing is normal.  Deep tendon reflexes: in the  upper and lower extremities are symmetric and intact.  Babinski maneuver response is  downgoing.   Assessment:  After physical and neurologic examination, review of laboratory studies, and pre-existing records, assessment will be reviewed on the problem list.  Tremor developed over the last 5 years , mild, and may be essential or Seroquel related, no parkinsonian changes to tone and posture.    Plan:  Treatment plan and additional workup will be reviewed under Problem  List.  Refilled Seroquel.  Klonopin is prescribed by Dr Laurance Flatten.   He is prn using Lunesta.

## 2014-05-07 ENCOUNTER — Ambulatory Visit (HOSPITAL_COMMUNITY): Admission: RE | Admit: 2014-05-07 | Payer: Medicare Other | Source: Ambulatory Visit

## 2014-05-13 ENCOUNTER — Ambulatory Visit (HOSPITAL_COMMUNITY)
Admission: RE | Admit: 2014-05-13 | Discharge: 2014-05-13 | Disposition: A | Discharge status: Home / Self Care | Payer: Medicare Other | Source: Ambulatory Visit | Attending: Urology | Admitting: Urology

## 2014-05-13 DIAGNOSIS — C61 Malignant neoplasm of prostate: Secondary | ICD-10-CM | POA: Diagnosis present

## 2014-05-13 MED ORDER — GADOBENATE DIMEGLUMINE 529 MG/ML IV SOLN
20.0000 mL | Freq: Once | INTRAVENOUS | Status: AC | PRN
  Administered 2014-05-13: 20 mL via INTRAVENOUS

## 2014-06-11 ENCOUNTER — Other Ambulatory Visit: Payer: Self-pay | Admitting: Family Medicine

## 2014-07-19 ENCOUNTER — Other Ambulatory Visit: Payer: Self-pay | Admitting: Neurology

## 2014-09-04 ENCOUNTER — Ambulatory Visit (INDEPENDENT_AMBULATORY_CARE_PROVIDER_SITE_OTHER): Payer: Medicare Other

## 2014-09-04 DIAGNOSIS — Z23 Encounter for immunization: Secondary | ICD-10-CM

## 2014-09-06 ENCOUNTER — Other Ambulatory Visit: Payer: Self-pay | Admitting: Family Medicine

## 2014-09-10 ENCOUNTER — Other Ambulatory Visit: Payer: Self-pay | Admitting: *Deleted

## 2014-10-24 ENCOUNTER — Ambulatory Visit: Payer: Medicare Other | Admitting: Family Medicine

## 2014-11-24 ENCOUNTER — Encounter: Payer: Self-pay | Admitting: Family Medicine

## 2014-11-24 ENCOUNTER — Ambulatory Visit (INDEPENDENT_AMBULATORY_CARE_PROVIDER_SITE_OTHER): Payer: Medicare Other | Admitting: Family Medicine

## 2014-11-24 VITALS — BP 133/81 | HR 70 | Temp 97.4°F | Ht 71.0 in | Wt 228.0 lb

## 2014-11-24 DIAGNOSIS — G4700 Insomnia, unspecified: Secondary | ICD-10-CM

## 2014-11-24 DIAGNOSIS — Z8546 Personal history of malignant neoplasm of prostate: Secondary | ICD-10-CM

## 2014-11-24 DIAGNOSIS — E669 Obesity, unspecified: Secondary | ICD-10-CM

## 2014-11-24 DIAGNOSIS — E039 Hypothyroidism, unspecified: Secondary | ICD-10-CM

## 2014-11-24 DIAGNOSIS — E785 Hyperlipidemia, unspecified: Secondary | ICD-10-CM

## 2014-11-24 DIAGNOSIS — E559 Vitamin D deficiency, unspecified: Secondary | ICD-10-CM

## 2014-11-24 LAB — POCT CBC
GRANULOCYTE PERCENT: 56.3 % (ref 37–80)
HCT, POC: 47.7 % (ref 43.5–53.7)
HEMOGLOBIN: 14.1 g/dL (ref 14.1–18.1)
LYMPH, POC: 3.8 — AB (ref 0.6–3.4)
MCH, POC: 27.1 pg (ref 27–31.2)
MCHC: 29.6 g/dL — AB (ref 31.8–35.4)
MCV: 91.6 fL (ref 80–97)
MPV: 8.8 fL (ref 0–99.8)
POC GRANULOCYTE: 5.5 (ref 2–6.9)
POC LYMPH PERCENT: 39.3 %L (ref 10–50)
Platelet Count, POC: 231 10*3/uL (ref 142–424)
RBC: 5.2 M/uL (ref 4.69–6.13)
RDW, POC: 14.3 %
WBC: 9.7 10*3/uL (ref 4.6–10.2)

## 2014-11-24 MED ORDER — LUNESTA 3 MG PO TABS: 3.0000 mg | ORAL_TABLET | Freq: Every day | ORAL | Status: DC

## 2014-11-24 NOTE — Progress Notes (Signed)
Subjective:    Patient ID: Benjamin Maxwell, male    DOB: 1946/02/01, 69 y.o.   MRN: [id number]  HPI Pt here for follow up and management of chronic medical problems which include hypothyroid, hyperlipidemia, and history of prostate cancer. He is followed regularly by the urologist. This is for his prostate cancer. He is taking medications regularly. On health maintenance issues he is due to get lab work done today and to return an FOBT. He gets his colonoscopies done every 5 years and this will not be due until 2018. The patient also has had issues in the past with sleep walking and he says this has not been a problem. The review of systems as indicated below were negative. He has been exercising more and trying to watch his diet more closely.        Patient Active Problem List   Diagnosis Date Noted  . 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 Insomnia 10/03/2013  . prostate cancer 10/03/2013  . Hyperlipidemia 10/03/2013  . Hypothyroidism 10/03/2013  . 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 clonazePAM (KLONOPIN) 0.5 MG tablet 1/2 tab q HS po.  . Eszopiclone 3 MG TABS TAKE 1 TABLET AT BEDTIME AS NEEDED  . levothyroxine (SYNTHROID, LEVOTHROID) 50 MCG tablet TAKE 1 TABLET (50 MCG TOTAL) BY MOUTH DAILY.  Marland Kitchen QUEtiapine (SEROQUEL) 50 MG tablet Take 1 tablet (50 mg total) by mouth at bedtime.  . [DISCONTINUED] QUEtiapine (SEROQUEL) 50 MG tablet TAKE 1 TABLET (50 MG TOTAL) BY MOUTH AT BEDTIME.     Review of Systems  Constitutional: Negative.   HENT: Negative.   Eyes: Negative.   Respiratory: Negative.   Cardiovascular: Negative.   Gastrointestinal: Negative.   Endocrine: Negative.   Genitourinary: Negative.   Musculoskeletal: Negative.   Skin: Negative.   Allergic/Immunologic: Negative.   Neurological: Negative.   Hematological: Negative.   Psychiatric/Behavioral: Negative.          Objective:   Physical Exam  Constitutional: He is oriented to person, place, and time. He appears well-developed and well-nourished. No distress.  Pleasant and alert  HENT:  Head: Normocephalic and atraumatic.  Right Ear: External ear normal.  Left Ear: External ear normal.  Nose: Nose normal.  Mouth/Throat: Oropharynx is clear and moist. No oropharyngeal exudate.  Eyes: Conjunctivae and EOM are normal. Pupils are equal, round, and reactive to light. Right eye exhibits no discharge. Left eye exhibits no discharge. No scleral icterus.  Neck: Normal range of motion. Neck supple. No thyromegaly present.  No thyromegaly anterior cervical nodes or bruits  Cardiovascular: Normal rate, regular rhythm, normal heart sounds and intact distal pulses.  Exam reveals no gallop and no friction rub.   No murmur heard. At 72/m  Pulmonary/Chest: Effort normal and breath sounds normal. No respiratory distress. He has no wheezes. He has no rales. He exhibits no tenderness.  Axillary regions are clear of adenopathy and chest wall is clear of masses  Abdominal: Soft. Bowel sounds are normal. He exhibits no mass. There is no tenderness. There is no rebound and no guarding.  Abdomen is obese without masses tenderness or organ enlargement. There are no inguinal nodes.  Genitourinary:  This exam is done by the urologist.  Musculoskeletal: Normal range of motion. He exhibits no edema or tenderness.  Lymphadenopathy:    He has no cervical adenopathy.  Neurological: He is alert and oriented to person, place, and time.  He has normal reflexes. No cranial nerve deficit.  Skin: Skin is warm and dry. No rash noted. No erythema. No pallor.  Resolving rash on both elbows from bites following a trip to [place]  Psychiatric: He has a normal mood and affect. His behavior is normal. Judgment and thought content normal.  Nursing note and vitals reviewed.  BP 133/81 mmHg  Pulse 70  Temp(Src) 97.4 F (36.3 C) (Oral)  Ht  _0  (1.803 m)  Wt 228 lb (103.42 kg)  BMI 31.81 kg/m2        Assessment & Plan:  1. Hyperlipidemia -Continue aggressive therapeutic lifestyle changes and these include exercise and diet - POCT CBC - BMP8+EGFR - NMR, lipoprofile - Hepatic function panel  2. Vitamin D deficiency -We will monitor this today and recommend treatment if necessary - POCT CBC - Vit D  25 hydroxy (rtn osteoporosis monitoring)  3. Hypothyroidism, unspecified hypothyroidism type -Continue current treatment of 50 g daily and depending on lab work we will let you know if this dosage needs to be changed - POCT CBC - Thyroid Panel With TSH  4. History of prostate cancer -Continue follow-up with urology - POCT CBC  5. Insomnia -Continue Lunesta as needed  6. Obesity -Continue to work aggressively with weight loss and diet  Meds ordered this encounter  Medications  . DISCONTD: ESZOPICLONE 3 MG tablet    Sig: Take 1 tablet (3 mg total) by mouth at bedtime. Take immediately before bedtime    Dispense:  90 tablet    Refill:  1  . ESZOPICLONE 3 MG tablet    Sig: Take 1 tablet (3 mg total) by mouth at bedtime. Take immediately before bedtime. NAME BRAND ONLY    Dispense:  90 tablet    Refill:  1    NAME BRAND ONLY   Patient Instructions                       Medicare Annual Wellness Visit  [facility] and the medical providers at [facility] strive to bring you the best medical care.  In doing so we not only want to address your current medical conditions and concerns but also to detect new conditions early and prevent illness, disease and health-related problems.    Medicare offers a yearly Wellness Visit which allows our clinical staff to assess your need for preventative services including immunizations, lifestyle education, counseling to decrease risk of preventable diseases and screening for fall risk and other medical concerns.    This visit is provided free of charge  (no copay) for all Medicare recipients. The clinical pharmacists at [facility] have begun to conduct these Wellness Visits which will also include a thorough review of all your medications.    As you primary medical provider recommend that you make an appointment for your Annual Wellness Visit if you have not done so already this year.  You may set up this appointment before you leave today or you may call back ([phone]) and schedule an appointment.  Please make sure when you call that you mention that you are scheduling your Annual Wellness Visit with the clinical pharmacist so that the appointment may be made for the proper length of time.     Continue current medications. Continue good therapeutic lifestyle changes which include good diet and exercise. Fall precautions discussed with patient. If an FOBT was given today- please return it to our front desk. If you  are over 28 years old - you may need Prevnar 54 or the adult Pneumonia vaccine.  Flu Shots are still available at our office. If you still haven't had one please call to set up a nurse visit to get one.   After your visit with [place] today you will receive a survey in the mail or online from [employer] regarding your care with [place]. Please take a moment to fill this out. Your feedback is very important to [place] as you can help [place] better understand your patient needs as well as improve your experience and satisfaction. WE CARE ABOUT YOU!!!   Continue to drink more water Continue to exercise regularly and continue to try to lose weight Watch diet closely Follow-up with urologist as planned Continue current medication   Arrie Senate MD

## 2014-11-24 NOTE — Patient Instructions (Addendum)
Medicare Annual Wellness Visit  [facility] and the medical providers at [facility] strive to bring you the best medical care.  In doing so we not only want to address your current medical conditions and concerns but also to detect new conditions early and prevent illness, disease and health-related problems.    Medicare offers a yearly Wellness Visit which allows our clinical staff to assess your need for preventative services including immunizations, lifestyle education, counseling to decrease risk of preventable diseases and screening for fall risk and other medical concerns.    This visit is provided free of charge (no copay) for all Medicare recipients. The clinical pharmacists at [facility] have begun to conduct these Wellness Visits which will also include a thorough review of all your medications.    As you primary medical provider recommend that you make an appointment for your Annual Wellness Visit if you have not done so already this year.  You may set up this appointment before you leave today or you may call back ([phone]) and schedule an appointment.  Please make sure when you call that you mention that you are scheduling your Annual Wellness Visit with the clinical pharmacist so that the appointment may be made for the proper length of time.     Continue current medications. Continue good therapeutic lifestyle changes which include good diet and exercise. Fall precautions discussed with patient. If an FOBT was given today- please return it to our front desk. If you are over 20 years old - you may need Prevnar 84 or the adult Pneumonia vaccine.  Flu Shots are still available at our office. If you still haven't had one please call to set up a nurse visit to get one.   After your visit with [place] today you will receive a survey in the mail or online from [employer] regarding your care with [place]. Please take a moment to  fill this out. Your feedback is very important to [place] as you can help [place] better understand your patient needs as well as improve your experience and satisfaction. WE CARE ABOUT YOU!!!   Continue to drink more water Continue to exercise regularly and continue to try to lose weight Watch diet closely Follow-up with urologist as planned Continue current medication

## 2014-11-25 LAB — NMR, LIPOPROFILE
Cholesterol: 215 mg/dL — ABNORMAL HIGH (ref 100–199)
HDL Cholesterol by NMR: 64 mg/dL (ref >39)
HDL PARTICLE NUMBER: 37.5 umol/L (ref >=30.5)
LDL Particle Number: 1500 nmol/L — ABNORMAL HIGH (ref <1000)
LDL SIZE: 20.8 nm (ref >20.5)
LDL-C: 133 mg/dL — ABNORMAL HIGH (ref 0–99)
LP-IR Score: 54 — ABNORMAL HIGH (ref <=45)
SMALL LDL PARTICLE NUMBER: 584 nmol/L — AB (ref <=527)
TRIGLYCERIDES BY NMR: 90 mg/dL (ref 0–149)

## 2014-11-25 LAB — BMP8+EGFR
BUN / CREAT RATIO: 16 (ref 10–22)
BUN: 14 mg/dL (ref 8–27)
CO2: 24 mmol/L (ref 18–29)
Calcium: 9 mg/dL (ref 8.6–10.2)
Chloride: 99 mmol/L (ref 97–108)
Creatinine, Ser: 0.88 mg/dL (ref 0.76–1.27)
GFR, EST AFRICAN AMERICAN: 103 mL/min/{1.73_m2} (ref >59)
GFR, EST NON AFRICAN AMERICAN: 90 mL/min/{1.73_m2} (ref >59)
GLUCOSE: 92 mg/dL (ref 65–99)
Potassium: 4.5 mmol/L (ref 3.5–5.2)
Sodium: 135 mmol/L (ref 134–144)

## 2014-11-25 LAB — HEPATIC FUNCTION PANEL
ALBUMIN: 4.2 g/dL (ref 3.6–4.8)
ALT: 20 IU/L (ref 0–44)
AST: 22 IU/L (ref 0–40)
Alkaline Phosphatase: 57 IU/L (ref 39–117)
BILIRUBIN TOTAL: 0.6 mg/dL (ref 0.0–1.2)
Bilirubin, Direct: 0.14 mg/dL (ref 0.00–0.40)
Total Protein: 7 g/dL (ref 6.0–8.5)

## 2014-11-25 LAB — THYROID PANEL WITH TSH
Free Thyroxine Index: 2.2 (ref 1.2–4.9)
T3 UPTAKE RATIO: 28 % (ref 24–39)
T4, Total: 7.9 ug/dL (ref 4.5–12.0)
TSH: 1.84 u[IU]/mL (ref 0.450–4.500)

## 2014-11-25 LAB — VITAMIN D 25 HYDROXY (VIT D DEFICIENCY, FRACTURES): Vit D, 25-Hydroxy: 35.5 ng/mL (ref 30.0–100.0)

## 2014-11-26 ENCOUNTER — Telehealth: Payer: Self-pay | Admitting: *Deleted

## 2014-11-26 NOTE — Telephone Encounter (Signed)
Pt states he is already taking 4,000 units qday of the Now brand. He takes it every morning with his multi vitamin. Please advise.

## 2014-11-26 NOTE — Telephone Encounter (Signed)
He is aware of lab results.  Has presc. For Crestor 10mg  been sent to [facility] on [address] [place].  Also he already take Vit D-4000 units a day.  Does he needs to increase or what???  Call Benjamin Maxwell at [phone] to discuss this.

## 2014-11-26 NOTE — Telephone Encounter (Signed)
-----   Message from Chipper Herb, MD sent at 11/25/2014  7:31 AM EST ----- The blood sugar is good at 92. The creatinine the most important kidney function test is within normal limits. The electrolytes including potassium are good and within normal limits. Cholesterol numbers with advanced lipid testing are significantly elevated since the last check 7 months ago the total LDL particle numbers now 1500 then it was 1093. The LDL C is 133. The triglycerides are good at 90. The patient must do better with diet and exercise and weight loss. Please start Crestor 10 mg, unless he has had problems with statin drugs in the past, and have him take the Crestor 1 daily and he should have liver function tests checked in 4-6 weeks. The vitamin D level is 35.5------- he should be taking vitamin D3, the now brand, 1 daily. He can purchase this at [facility] or the drugstore in [place] All liver function tests are within normal limits All thyroid function tests are within normal limits------- continue current treatment Please give this patient a hard copy of this will report so that he can take it to his urologist+++++++

## 2014-11-26 NOTE — Telephone Encounter (Signed)
Have the patient finish up taking 4000 units daily and the next time he purchases vitamin D have him purchase the 5000 unit size and take 5000  1 daily

## 2014-11-26 NOTE — Telephone Encounter (Signed)
lmtcb regarding test results. 

## 2014-11-26 NOTE — Telephone Encounter (Signed)
Also told the patient that we will be sending him in the mail some information about high altitude travel as I told him I would do at the visit yesterday

## 2014-11-27 ENCOUNTER — Other Ambulatory Visit: Payer: Medicare Other

## 2014-11-27 DIAGNOSIS — Z1212 Encounter for screening for malignant neoplasm of rectum: Secondary | ICD-10-CM

## 2014-11-27 NOTE — Progress Notes (Signed)
Lab only 

## 2014-11-28 ENCOUNTER — Telehealth: Payer: Self-pay | Admitting: Family Medicine

## 2014-11-28 NOTE — Telephone Encounter (Signed)
Detailed message left for patient that it is ok to take vitamin d and multi vitamin together.

## 2014-11-28 NOTE — Telephone Encounter (Signed)
Detailed message left for nurse.

## 2014-11-29 LAB — FECAL OCCULT BLOOD, IMMUNOCHEMICAL: Fecal Occult Bld: NEGATIVE

## 2014-12-02 ENCOUNTER — Encounter: Payer: Self-pay | Admitting: Family Medicine

## 2014-12-26 ENCOUNTER — Other Ambulatory Visit: Payer: Self-pay

## 2014-12-26 MED ORDER — CLONAZEPAM 0.5 MG PO TABS: ORAL_TABLET | ORAL | Status: DC

## 2015-01-27 ENCOUNTER — Telehealth: Payer: Self-pay | Admitting: Family Medicine

## 2015-01-27 ENCOUNTER — Other Ambulatory Visit: Payer: Self-pay | Admitting: *Deleted

## 2015-01-27 DIAGNOSIS — M25561 Pain in right knee: Secondary | ICD-10-CM

## 2015-01-27 NOTE — Telephone Encounter (Signed)
Pt wants 2nd opinion for ortho - right knee pain  Wants referral to dr Ronaldo Miyamoto with [facility].   Referral placed

## 2015-02-10 ENCOUNTER — Telehealth: Payer: Self-pay | Admitting: Family Medicine

## 2015-02-10 NOTE — Telephone Encounter (Signed)
Prior Aurth - ??

## 2015-02-18 ENCOUNTER — Telehealth: Payer: Self-pay

## 2015-02-18 MED ORDER — DOXEPIN HCL 3 MG PO TABS: 1.0000 | ORAL_TABLET | Freq: Every evening | ORAL | Status: DC | PRN

## 2015-02-18 NOTE — Telephone Encounter (Signed)
Insurance denied prior authorization for Eszopiclone 3 mg   Must try and fail two preferred meds one being Silenor

## 2015-02-18 NOTE — Telephone Encounter (Signed)
Try silenor--- as directed

## 2015-04-09 ENCOUNTER — Other Ambulatory Visit: Payer: Self-pay | Admitting: Neurology

## 2015-04-09 NOTE — Telephone Encounter (Signed)
Patient has appt in Aug

## 2015-05-05 ENCOUNTER — Ambulatory Visit: Payer: Medicare Other | Admitting: Neurology

## 2015-05-19 ENCOUNTER — Telehealth: Payer: Self-pay

## 2015-05-19 NOTE — Telephone Encounter (Signed)
Left a message on both home and cell phone asking him to call me back. Dr. Brett Fairy will not be here during pt's appt time, and he needs to be rescheduled.

## 2015-05-19 NOTE — Telephone Encounter (Signed)
Pt returned my call and is agreeable to changing his 10:00 appt to 12:00 on 8/3 because Dr. Brett Fairy will not be in the office.

## 2015-05-20 ENCOUNTER — Encounter: Payer: Self-pay | Admitting: Neurology

## 2015-05-20 ENCOUNTER — Ambulatory Visit: Payer: Medicare Other | Admitting: Neurology

## 2015-05-20 ENCOUNTER — Ambulatory Visit (INDEPENDENT_AMBULATORY_CARE_PROVIDER_SITE_OTHER): Payer: Medicare Other | Admitting: Neurology

## 2015-05-20 VITALS — BP 142/98 | HR 80 | Resp 20 | Ht 70.0 in | Wt 212.0 lb

## 2015-05-20 DIAGNOSIS — G4752 REM sleep behavior disorder: Secondary | ICD-10-CM

## 2015-05-20 DIAGNOSIS — G4759 Other parasomnia: Secondary | ICD-10-CM

## 2015-05-20 MED ORDER — LUNESTA 3 MG PO TABS: 3.0000 mg | ORAL_TABLET | Freq: Every day | ORAL | Status: DC

## 2015-05-20 MED ORDER — QUETIAPINE FUMARATE 50 MG PO TABS: 50.0000 mg | ORAL_TABLET | Freq: Every day | ORAL | Status: DC

## 2015-05-20 NOTE — Progress Notes (Signed)
[facility]  Provider:  Dr Tadd Holtmeyer Referring Provider: Chipper Herb, MD Primary Care Physician:  Redge Gainer, MD  Chief Complaint  Patient presents with  . Follow-up    medication refill, rm 10, alone    HPI:  Benjamin Maxwell is a 69 y.o. male here as a referral from Dr. Laurance Flatten for follow up on Parasomnia, treated with Klonopin.  There has again been no breakthrough activity of parasomnias on Klonopin.   [place] 7 FSS 40 .   The patient has been followed here for 10 years. He is an active traveler and has just traveled again through [place], [place] and [place].   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 As past medical history I need to add  that he had altered to thickness and 2013 in [place], when he reached the 5000 m range. His  insomnia in high altitude was probably related to central apnea . The patient's bedtime is around 10 PM he arises at about 6:30 in the morning, he break spontaneously and does not need an alarm. We can speak days a fairly similar in sleep times he may have one bathroom break at night. There is no history of falls at night of dizziness or light headedness.  He failed Doxepin and Ambien, needs to go back on Lunesta.      Review of Systems: Out of a complete 14 system review, the patient complains of only the following symptoms, and all other reviewed systems are negative. parasomnia   History   Social History  . Marital Status: Divorced    Spouse Name: N/A  . Number of Children: 2  . Years of Education: college   Occupational History  . 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 Smoking status: Never Smoker   . Smokeless tobacco: Never Used  . Alcohol Use: Yes     Comment: Social  . Drug Use: No  . Sexual Activity: Not on file   Other Topics Concern  . Not on file   Social History Narrative   Patient is a Tree surgeon, works full time at [employer]. Patient has a college  education. Patient is divorced.  Lives alone   Patient is right-handed.   Patient drinks very little caffeine (tea).    Family History  Problem Relation Age of Onset  . Atrial fibrillation Mother   . Pneumonia Father   . Cancer Father     Past Medical History  Diagnosis Date  . Syncope and collapse   . 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 Hyperlipidemia   . Hypothyroid   . Pulmonary nodules   . Diverticulosis   . Spondylolysis   . Cancer     Prostate, followed by urology    Past Surgical History  Procedure Laterality Date  . Knee surgery Bilateral   . Umbilical hernia repair      Current Outpatient Prescriptions  Medication Sig Dispense Refill  . clonazePAM (KLONOPIN) 0.5 MG tablet 1/2 tab q HS po. 45 tablet 1  . ESZOPICLONE 3 MG tablet Take 1 tablet (3 mg total) by mouth at bedtime. Take immediately before bedtime. NAME BRAND ONLY 90 tablet 1  . levothyroxine (SYNTHROID, LEVOTHROID) 50 MCG tablet TAKE 1 TABLET (50 MCG TOTAL) BY MOUTH DAILY. 90 tablet 4  . QUEtiapine (SEROQUEL) 50 MG tablet Take 1 tablet (50 mg total) by mouth at bedtime. 90 tablet 3  .  QUEtiapine (SEROQUEL) 50 MG tablet TAKE 1 TABLET (50 MG TOTAL) BY MOUTH AT BEDTIME. 90 tablet 0  . Doxepin HCl 3 MG TABS Take 1 tablet (3 mg total) by mouth at bedtime as needed. (Patient not taking: Reported on 05/20/2015) 30 tablet 1   No current facility-administered medications for this visit.    Allergies as of 05/20/2015 - Review Complete 05/20/2015  Allergen Reaction Noted  . Levitra [vardenafil]  10/03/2013    Vitals: BP 142/98 mmHg  Pulse 80  Resp 20  Ht 5\' 10"  (1.778 m)  Wt 212 lb (96.163 kg)  BMI 30.42 kg/m2 Last Weight:  Wt Readings from Last 1 Encounters:  05/20/15 212 lb (96.163 kg)   Last Height:   Ht Readings from Last 1 Encounters:  05/20/15 5\' 10"  (1.778 m)   Vision Screening:  Left eye with correction.                                    Right eye with  correction 20/20.  Physical exam:  General: The patient is awake, alert and appears not in acute distress. The patient is well groomed. Head: Normocephalic, atraumatic. Neck is supple. Mallampati 2, neck circumference: 48.0 , TMJ click on the right.  Cardiovascular:  Regular rate and rhythm, Skin:  Without evidence of edema, or rash Trunk: BMI is elevated, and patient  has normal posture.  Neurologic exam : The patient is awake and alert, oriented to place and time.   Memory subjective  described as intact. There is a normal attention span & concentration ability.  Speech is fluent without  dysarthria, dysphonia or aphasia.  Mood and affect are appropriate.  Cranial nerves: Pupils are equal and briskly reactive to light. Funduscopic exam without  evidence of pallor or edema.  Extraocular movements  in vertical and horizontal planes intact and without nystagmus.  Visual fields by finger perimetry are intact. Hearing to finger rub intact.  Facial sensation intact to fine touch.  Facial motor strength is symmetric and tongue and uvula move midline.  Motor exam: Normal tone and normal muscle bulk and symmetric normal strength in all extremities.  Sensory:  Fine touch, pinprick and vibration were tested in all extremities. Proprioception is  normal.  Coordination: Rapid alternating movements in the fingers/hands is tested and normal.  Finger-to-nose normal without evidence of ataxia, dysmetria or tremor.  Gait and station: Patient walks without assistive device  Strength within normal limits.  Stance is stable and normal.  Tandem gait is unfragmented. Romberg testing is normal.  Deep tendon reflexes: in the  upper and lower extremities are symmetric and intact.  Babinski maneuver response is  downgoing.   Assessment:  After physical and neurologic examination, review of laboratory studies, and pre-existing records, assessment will be reviewed on the problem list.  15 minute visit for  refill, Lunesta, as he has failed Zolpidem and doxepin.  Tremor developed over the last 5 years , mild, and may be essential or Seroquel related, no parkinsonian changes to tone and posture.    Plan:  Treatment plan and additional workup will be reviewed under Problem List.  Refilled Seroquel/ Lunesta.  Klonopin is prescribed by Dr Laurance Flatten.   He is prn using Lunesta.

## 2015-05-20 NOTE — Patient Instructions (Signed)
Eszopiclone tablets What is this medicine? ESZOPICLONE (es ZOE pi clone) is used to treat insomnia. This medicine helps you to fall asleep and sleep through the night. This medicine may be used for other purposes; ask your health care provider or pharmacist if you have questions. COMMON BRAND NAME(S): Lunesta What should I tell my health care provider before I take this medicine? They need to know if you have any of these conditions: -depression -history of a drug or alcohol abuse problem -liver disease -lung or breathing disease -suicidal thoughts -an unusual or allergic reaction to eszopiclone, other medicines, foods, dyes, or preservatives -pregnant or trying to get pregnant -breast-feeding How should I use this medicine? Take this medicine by mouth with a glass of water. Follow the directions on the prescription label. It is better to take this medicine on an empty stomach and only when you are ready for bed. Do not take your medicine more often than directed. If you have been taking this medicine for several weeks and suddenly stop taking it, you may get unpleasant withdrawal symptoms. Your doctor or health care professional may want to gradually reduce the dose. Do not stop taking this medicine on your own. Always follow your doctor or health care professional's advice. Talk to your pediatrician regarding the use of this medicine in children. Special care may be needed. Overdosage: If you think you have taken too much of this medicine contact a poison control center or emergency room at once. NOTE: This medicine is only for you. Do not share this medicine with others. What if I miss a dose? This does not apply. This medicine should only be taken immediately before going to sleep. Do not take double or extra doses. What may interact with this medicine? -herbal medicines like kava kava, melatonin, St. John's wort and valerian -lorazepam -medicines for fungal infections like ketoconazole,  fluconazole, or itraconazole -olanzapine This list may not describe all possible interactions. Give your health care provider a list of all the medicines, herbs, non-prescription drugs, or dietary supplements you use. Also tell them if you smoke, drink alcohol, or use illegal drugs. Some items may interact with your medicine. What should I watch for while using this medicine? Visit your doctor or health care professional for regular checks on your progress. Keep a regular sleep schedule by going to bed at about the same time nightly. Avoid caffeine-containing drinks in the evening hours, as caffeine can cause trouble with falling asleep. Talk to your doctor if you still have trouble sleeping. Do not take this medicine unless you are able to get a full night's sleep before you must be active again. You may not be able to remember things that you do in the hours after you take this medicine. Some people have reported driving, making phone calls, or preparing and eating food while asleep after taking sleep medicine. Take this medicine right before going to sleep. Tell your doctor if you have any problems with your memory. After you stop taking this medicine, you may notice some trouble falling asleep. This is called rebound insomnia. This problem usually goes away on its own after 1 or 2 nights. You may get drowsy or dizzy. Do not drive, use machinery, or do anything that needs mental alertness until you know how this medicine affects you. Do not stand or sit up quickly, especially if you are an older patient. This reduces the risk of dizzy or fainting spells. Alcohol may interfere with the effect of this medicine.  Avoid alcoholic drinks. This medicine may cause a decrease in mental alertness the day after use, even if you feel that you are fully awake. Tell your doctor if you will need to perform activities requiring full alertness, such as driving, the next day after you have taken this medicine. What side  effects may I notice from receiving this medicine? Side effects that you should report to your doctor or health care professional as soon as possible: -allergic reactions like skin rash, itching or hives, swelling of the face, lips, or tongue -changes in vision -confusion -depressed mood -feeling faint or lightheaded, falls -hallucinations -problems with balance, speaking, walking -restlessness, excitability, or feelings of agitation -unusual activities while asleep like driving, eating, making phone calls Side effects that usually do not require medical attention (report to your doctor or health care professional if they continue or are bothersome): -dizziness, or daytime drowsiness, sometimes called a hangover effect -headache This list may not describe all possible side effects. Call your doctor for medical advice about side effects. You may report side effects to FDA at 1-800-FDA-1088. Where should I keep my medicine? Keep out of the reach of children. This medicine can be abused. Keep your medicine in a safe place to protect it from theft. Do not share this medicine with anyone. Selling or giving away this medicine is dangerous and against the law. Store at room temperature between 15 and 30 degrees C (59 and 86 degrees F). Throw away any unused medicine after the expiration date. NOTE: This sheet is a summary. It may not cover all possible information. If you have questions about this medicine, talk to your doctor, pharmacist, or health care provider.  2015, Elsevier/Gold Standard. (2013-02-28 17:42:58)

## 2015-05-26 ENCOUNTER — Ambulatory Visit: Payer: Medicare Other | Admitting: Family Medicine

## 2015-05-27 ENCOUNTER — Telehealth: Payer: Self-pay | Admitting: Neurology

## 2015-05-27 DIAGNOSIS — G4700 Insomnia, unspecified: Secondary | ICD-10-CM

## 2015-05-27 DIAGNOSIS — G4750 Parasomnia, unspecified: Secondary | ICD-10-CM

## 2015-05-27 NOTE — Telephone Encounter (Signed)
Pt called and informed me that his insurance doesn't cover lunesta. He wants something else for insomnia. He has failed ambien and doxepin. I advised pt that I would speak to Dr. Brett Fairy and call him back tomorrow wither her recommendations. Pt verbalized understanding.

## 2015-05-27 NOTE — Telephone Encounter (Signed)
Benjamin Maxwell is being kicked out by insurance as non covered, pt would like a call back

## 2015-05-28 NOTE — Telephone Encounter (Signed)
I called pt, no answer, asked pt to call me back when he has a second.

## 2015-05-29 NOTE — Telephone Encounter (Signed)
Patient called returning Kristen's call. He can be reached at [phone]

## 2015-06-01 MED ORDER — SUVOREXANT 10 MG PO TABS: 10.0000 mg | ORAL_TABLET | Freq: Every evening | ORAL | Status: DC

## 2015-06-01 MED ORDER — SUVOREXANT 10 MG PO TABS: 10.0000 mg | ORAL_TABLET | Freq: Every evening | ORAL | Status: DC | PRN

## 2015-06-01 MED ORDER — SUVOREXANT 15 MG PO TABS: 15.0000 mg | ORAL_TABLET | Freq: Every evening | ORAL | Status: DC | PRN

## 2015-06-01 NOTE — Telephone Encounter (Signed)
[name] samples 10 mg and 15 mg 3 packages each for 9 tablets each placed at front desk for pick up. Pt to let us know which dosage works best for him.

## 2015-06-01 NOTE — Telephone Encounter (Signed)
Patient called to say he will be by about 2pm to pick up samples.

## 2015-06-01 NOTE — Telephone Encounter (Signed)
[name] is reported that he has failed Ambien and doxepin and that Benjamin Maxwell was still not covered by his insurance after failing these other drugs. I offered him belsombra as the next step. My recommendation is for him to pick up belts some right as a sample he could try 10 and 15 mg and see how this works for him. He could also obtain a 10 tablet for free sample prescription that he can fill if he sees this medication as a success.  I left two voice mails,  one on his home phone #[phone] and 1 on his cell phone [phone]. I'm expecting him to call back. CD

## 2015-06-01 NOTE — Addendum Note (Signed)
Addended by: Lester Browning A on: 06/01/2015 10:57 AM   Modules accepted: Orders

## 2015-06-08 ENCOUNTER — Telehealth: Payer: Self-pay | Admitting: Neurology

## 2015-06-08 NOTE — Telephone Encounter (Signed)
[employer] called about approval for medication Lunesta effective date 8-21 for 1 year . May call [phone].

## 2015-06-08 NOTE — Telephone Encounter (Signed)
Ins has decided to grant an exception, and approved coverage on Lunesta effective until 06/06/2016 Ref # [id number]

## 2015-06-11 ENCOUNTER — Ambulatory Visit: Payer: Medicare Other | Admitting: Family Medicine

## 2015-06-15 NOTE — Telephone Encounter (Signed)
I spoke with Elta Guadeloupe, pharmacist at [facility].  He verified they do have an Rx on file for [place], and can fill it today.  I called the patient back.  Says he has completed [name] samples, and would like to continue getting Lunesta at this time.  Says if he decides he would like to change Rx's he will call back ad let us know.  Patient will follow up with pharmacy regarding when Rx will be ready for pick up.  He will call us back if anything further is needed.

## 2015-06-15 NOTE — Telephone Encounter (Signed)
Pt called to say thank you and to please fill Rx for Lunesta. Thank you

## 2015-06-16 ENCOUNTER — Ambulatory Visit (INDEPENDENT_AMBULATORY_CARE_PROVIDER_SITE_OTHER): Payer: Medicare Other | Admitting: Family Medicine

## 2015-06-16 ENCOUNTER — Encounter: Payer: Self-pay | Admitting: Family Medicine

## 2015-06-16 VITALS — BP 124/83 | HR 64 | Temp 97.5°F | Ht 70.0 in | Wt 216.0 lb

## 2015-06-16 DIAGNOSIS — R251 Tremor, unspecified: Secondary | ICD-10-CM

## 2015-06-16 DIAGNOSIS — E559 Vitamin D deficiency, unspecified: Secondary | ICD-10-CM | POA: Diagnosis not present

## 2015-06-16 DIAGNOSIS — E039 Hypothyroidism, unspecified: Secondary | ICD-10-CM

## 2015-06-16 DIAGNOSIS — E785 Hyperlipidemia, unspecified: Secondary | ICD-10-CM

## 2015-06-16 NOTE — Patient Instructions (Addendum)
Medicare Annual Wellness Visit  [facility] and the medical providers at [facility] strive to bring you the best medical care.  In doing so we not only want to address your current medical conditions and concerns but also to detect new conditions early and prevent illness, disease and health-related problems.    Medicare offers a yearly Wellness Visit which allows our clinical staff to assess your need for preventative services including immunizations, lifestyle education, counseling to decrease risk of preventable diseases and screening for fall risk and other medical concerns.    This visit is provided free of charge (no copay) for all Medicare recipients. The clinical pharmacists at [facility] have begun to conduct these Wellness Visits which will also include a thorough review of all your medications.    As you primary medical provider recommend that you make an appointment for your Annual Wellness Visit if you have not done so already this year.  You may set up this appointment before you leave today or you may call back [phone]) and schedule an appointment.  Please make sure when you call that you mention that you are scheduling your Annual Wellness Visit with the clinical pharmacist so that the appointment may be made for the proper length of time.     Continue current medications. Continue good therapeutic lifestyle changes which include good diet and exercise. Fall precautions discussed with patient. If an FOBT was given today- please return it to our front desk. If you are over 67 years old - you may need Prevnar 51 or the adult Pneumonia vaccine.  **Flu shots will be available soon--- please call and schedule a FLU-CLINIC appointment**  After your visit with [place] today you will receive a survey in the mail or online from [employer] regarding your care with [place]. Please take a moment to fill this out. Your feedback is  very important to [place] as you can help [place] better understand your patient needs as well as improve your experience and satisfaction. WE CARE ABOUT YOU!!!   **Please join [place] SEPT.[email or web address], 2016 from 5:00 to 7:00pm for our OPEN HOUSE! Come out and meet our NEW providers** The patient should taper off his Seroquel as directed over one month He should follow-up with urology as planned We should monitor the tremor and see if it does not get better with coming off of the Seroquel If the tremor persists despite removal of this medication then we will make an appointment with a neurologist for further evaluation Continue to work on weight loss with exercise and diet We will call you with lab work as soon as that becomes available and we will send a copy to the neurologist and to the urologist

## 2015-06-16 NOTE — Progress Notes (Signed)
Subjective:    Patient ID: Benjamin Maxwell, male    DOB: 04-04-1946, 69 y.o.   MRN: [id number]  HPI Pt here for follow up and management of chronic medical problems which includes hyperlipidemia and hypothyroid. He is taking medications regularly. The patient does complain of some tremors with this being worse on the right than the left. He is taking Seroquel. He has been taking this for years. He is only developed a tremor symptoms recently. It is mostly an intentional tremor. There is no family history of Parkinson's. The patient denies chest pain shortness of breath trouble swallowing heartburn indigestion and nausea vomiting or diarrhea. Is also passing his water without problems. The urologist is planning to do a prostate biopsy in October. The patient is unsure of what his most recent PSA was.      Patient Active Problem List   Diagnosis Date Noted  . Obesity 11/24/2014  . Vitamin D deficiency 11/24/2014  . 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 Insomnia 10/03/2013  . prostate cancer 10/03/2013  . Hyperlipidemia 10/03/2013  . Hypothyroidism 10/03/2013  . 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 clonazePAM (KLONOPIN) 0.5 MG tablet 1/2 tab q HS po.  . levothyroxine (SYNTHROID, LEVOTHROID) 50 MCG tablet TAKE 1 TABLET (50 MCG TOTAL) BY MOUTH DAILY.  Marland Kitchen QUEtiapine (SEROQUEL) 50 MG tablet Take 1 tablet (50 mg total) by mouth at bedtime.  . Suvorexant ([name]) 15 MG TABS Take 15 mg by mouth at bedtime as needed.  . [DISCONTINUED] QUEtiapine (SEROQUEL) 50 MG tablet Take 1 tablet (50 mg total) by mouth at bedtime.  . [DISCONTINUED] ESZOPICLONE 3 MG tablet Take 1 tablet (3 mg total) by mouth at bedtime. Take immediately before bedtime. NAME BRAND ONLY  . [DISCONTINUED] Suvorexant ([name]) 10 MG TABS Take 10 mg by mouth Nightly.  . [DISCONTINUED] Suvorexant ([name]) 10 MG TABS Take 10 mg by mouth  at bedtime as needed.   No facility-administered encounter medications on file as of 06/16/2015.     Review of Systems  Constitutional: Negative.   HENT: Negative.   Eyes: Negative.   Respiratory: Negative.   Cardiovascular: Negative.   Gastrointestinal: Negative.   Endocrine: Negative.   Genitourinary: Negative.   Musculoskeletal: Negative.   Skin: Negative.   Allergic/Immunologic: Negative.   Neurological: Positive for tremors (right worse).  Hematological: Negative.   Psychiatric/Behavioral: Negative.        Objective:   Physical Exam  Constitutional: He is oriented to person, place, and time. He appears well-developed and well-nourished. No distress.  HENT:  Head: Normocephalic and atraumatic.  Right Ear: External ear normal.  Left Ear: External ear normal.  Nose: Nose normal.  Mouth/Throat: Oropharynx is clear and moist. No oropharyngeal exudate.  Eyes: Conjunctivae and EOM are normal. Pupils are equal, round, and reactive to light. Right eye exhibits no discharge. Left eye exhibits no discharge. No scleral icterus.  Neck: Normal range of motion. Neck supple. No thyromegaly present.  There are no carotid bruits or anterior cervical adenopathy  Cardiovascular: Normal rate, regular rhythm, normal heart sounds and intact distal pulses.  Exam reveals no gallop and no friction rub.   No murmur heard. The heart has a regular rate and rhythm at 72/m  Pulmonary/Chest: Effort normal and breath sounds normal. No respiratory distress. He has no wheezes. He has no rales. He exhibits no tenderness.  There is no axillary adenopathy and the chest  is clear anteriorly and posteriorly  Abdominal: Soft. Bowel sounds are normal. He exhibits no mass. There is no tenderness. There is no rebound and no guarding.  The abdomen is obese without masses tenderness or organ enlargement. There is no inguinal adenopathy.  Genitourinary:  This was just done by his urologist  Musculoskeletal: Normal  range of motion. He exhibits no edema or tenderness.  Lymphadenopathy:    He has no cervical adenopathy.  Neurological: He is alert and oriented to person, place, and time. He has normal reflexes. No cranial nerve deficit.  Skin: Skin is warm and dry. No rash noted.  Psychiatric: He has a normal mood and affect. His behavior is normal. Judgment and thought content normal.  Nursing note and vitals reviewed.  BP 124/83 mmHg  Pulse 64  Temp(Src) 97.5 F (36.4 C) (Oral)  Ht _0  (1.778 m)  Wt 216 lb (97.977 kg)  BMI 30.99 kg/m2        Assessment & Plan:  1. Hyperlipidemia -The patient must continue with aggressive therapeutic lifestyle changes and weight loss - BMP8+EGFR - CBC with Differential/Platelet - Hepatic function panel - NMR, lipoprofile  2. Vitamin D deficiency -He has vitamin D deficiency but is currently not taking any medication for this. We will determine further need for this once the lab work is returned - CBC with Differential/Platelet - Vit D  25 hydroxy (rtn osteoporosis monitoring)  3. Hypothyroidism, unspecified hypothyroidism type -He should continue with his current thyroid treatment pending results of lab work - CBC with Differential/Platelet  4. Tremor of both hands -He should discontinue over 1 month's time his Seroquel completely and we will reevaluate the tremor issue in December.  5. Prostate cancer -Follow-up with urology as planned  Patient Instructions                       Medicare Annual Wellness Visit  [facility] and the medical providers at [facility] strive to bring you the best medical care.  In doing so we not only want to address your current medical conditions and concerns but also to detect new conditions early and prevent illness, disease and health-related problems.    Medicare offers a yearly Wellness Visit which allows our clinical staff to assess your need for preventative services including  immunizations, lifestyle education, counseling to decrease risk of preventable diseases and screening for fall risk and other medical concerns.    This visit is provided free of charge (no copay) for all Medicare recipients. The clinical pharmacists at [facility] have begun to conduct these Wellness Visits which will also include a thorough review of all your medications.    As you primary medical provider recommend that you make an appointment for your Annual Wellness Visit if you have not done so already this year.  You may set up this appointment before you leave today or you may call back ([phone]) and schedule an appointment.  Please make sure when you call that you mention that you are scheduling your Annual Wellness Visit with the clinical pharmacist so that the appointment may be made for the proper length of time.     Continue current medications. Continue good therapeutic lifestyle changes which include good diet and exercise. Fall precautions discussed with patient. If an FOBT was given today- please return it to our front desk. If you are over 33 years old - you may need Prevnar 88 or the adult Pneumonia  vaccine.  **Flu shots will be available soon--- please call and schedule a FLU-CLINIC appointment**  After your visit with [place] today you will receive a survey in the mail or online from [employer] regarding your care with [place]. Please take a moment to fill this out. Your feedback is very important to [place] as you can help [place] better understand your patient needs as well as improve your experience and satisfaction. WE CARE ABOUT YOU!!!   **Please join [place] SEPT.[email or web address], 2016 from 5:00 to 7:00pm for our OPEN HOUSE! Come out and meet our NEW providers** The patient should taper off his Seroquel as directed over one month He should follow-up with urology as planned We should monitor the tremor and see if it does not get better with coming off of the Seroquel If the tremor  persists despite removal of this medication then we will make an appointment with a neurologist for further evaluation Continue to work on weight loss with exercise and diet We will call you with lab work as soon as that becomes available and we will send a copy to the neurologist and to the urologist   Arrie Senate MD

## 2015-06-17 ENCOUNTER — Other Ambulatory Visit: Payer: Self-pay | Admitting: *Deleted

## 2015-06-17 LAB — CBC WITH DIFFERENTIAL/PLATELET
BASOS ABS: 0.1 10*3/uL (ref 0.0–0.2)
Basos: 1 %
EOS (ABSOLUTE): 0.4 10*3/uL (ref 0.0–0.4)
EOS: 5 %
HEMATOCRIT: 43.8 % (ref 37.5–51.0)
HEMOGLOBIN: 14.4 g/dL (ref 12.6–17.7)
IMMATURE GRANS (ABS): 0 10*3/uL (ref 0.0–0.1)
Immature Granulocytes: 0 %
LYMPHS ABS: 3.4 10*3/uL — AB (ref 0.7–3.1)
LYMPHS: 44 %
MCH: 30.1 pg (ref 26.6–33.0)
MCHC: 32.9 g/dL (ref 31.5–35.7)
MCV: 91 fL (ref 79–97)
MONOCYTES: 8 %
Monocytes Absolute: 0.6 10*3/uL (ref 0.1–0.9)
NEUTROS ABS: 3.2 10*3/uL (ref 1.4–7.0)
Neutrophils: 42 %
PLATELETS: 225 10*3/uL (ref 150–379)
RBC: 4.79 x10E6/uL (ref 4.14–5.80)
RDW: 13.7 % (ref 12.3–15.4)
WBC: 7.6 10*3/uL (ref 3.4–10.8)

## 2015-06-17 LAB — NMR, LIPOPROFILE
CHOLESTEROL: 235 mg/dL — AB (ref 100–199)
HDL CHOLESTEROL BY NMR: 76 mg/dL (ref >39)
HDL PARTICLE NUMBER: 43.8 umol/L (ref >=30.5)
LDL Particle Number: 1680 nmol/L — ABNORMAL HIGH (ref <1000)
LDL Size: 21.1 nm (ref >20.5)
LDL-C: 137 mg/dL — AB (ref 0–99)
LP-IR Score: 44 (ref <=45)
SMALL LDL PARTICLE NUMBER: 513 nmol/L (ref <=527)
TRIGLYCERIDES BY NMR: 111 mg/dL (ref 0–149)

## 2015-06-17 LAB — HEPATIC FUNCTION PANEL
ALBUMIN: 4.4 g/dL (ref 3.6–4.8)
ALT: 17 IU/L (ref 0–44)
AST: 18 IU/L (ref 0–40)
Alkaline Phosphatase: 52 IU/L (ref 39–117)
BILIRUBIN TOTAL: 0.8 mg/dL (ref 0.0–1.2)
Bilirubin, Direct: 0.21 mg/dL (ref 0.00–0.40)
TOTAL PROTEIN: 7.1 g/dL (ref 6.0–8.5)

## 2015-06-17 LAB — BMP8+EGFR
BUN/Creatinine Ratio: 19 (ref 10–22)
BUN: 19 mg/dL (ref 8–27)
CO2: 21 mmol/L (ref 18–29)
Calcium: 9.7 mg/dL (ref 8.6–10.2)
Chloride: 97 mmol/L (ref 97–108)
Creatinine, Ser: 0.99 mg/dL (ref 0.76–1.27)
GFR calc Af Amer: 91 mL/min/{1.73_m2} (ref >59)
GFR, EST NON AFRICAN AMERICAN: 79 mL/min/{1.73_m2} (ref >59)
Glucose: 89 mg/dL (ref 65–99)
POTASSIUM: 4.9 mmol/L (ref 3.5–5.2)
Sodium: 137 mmol/L (ref 134–144)

## 2015-06-17 LAB — VITAMIN D 25 HYDROXY (VIT D DEFICIENCY, FRACTURES): Vit D, 25-Hydroxy: 49.2 ng/mL (ref 30.0–100.0)

## 2015-06-17 MED ORDER — ROSUVASTATIN CALCIUM 10 MG PO TABS: 10.0000 mg | ORAL_TABLET | Freq: Every day | ORAL | Status: DC

## 2015-06-24 ENCOUNTER — Other Ambulatory Visit: Payer: Medicare Other

## 2015-06-24 DIAGNOSIS — Z1212 Encounter for screening for malignant neoplasm of rectum: Secondary | ICD-10-CM

## 2015-06-26 LAB — FECAL OCCULT BLOOD, IMMUNOCHEMICAL: Fecal Occult Bld: NEGATIVE

## 2015-07-14 ENCOUNTER — Other Ambulatory Visit: Payer: Self-pay | Admitting: Family Medicine

## 2015-07-31 ENCOUNTER — Other Ambulatory Visit: Payer: Medicare Other

## 2015-07-31 DIAGNOSIS — E785 Hyperlipidemia, unspecified: Secondary | ICD-10-CM

## 2015-08-01 LAB — HEPATIC FUNCTION PANEL
ALBUMIN: 4.3 g/dL (ref 3.6–4.8)
ALT: 23 IU/L (ref 0–44)
AST: 25 IU/L (ref 0–40)
Alkaline Phosphatase: 57 IU/L (ref 39–117)
BILIRUBIN TOTAL: 1 mg/dL (ref 0.0–1.2)
Bilirubin, Direct: 0.23 mg/dL (ref 0.00–0.40)
Total Protein: 7.2 g/dL (ref 6.0–8.5)

## 2015-10-09 ENCOUNTER — Other Ambulatory Visit: Payer: Self-pay | Admitting: Family Medicine

## 2015-10-12 ENCOUNTER — Other Ambulatory Visit: Payer: Self-pay | Admitting: Neurology

## 2015-11-06 ENCOUNTER — Telehealth: Payer: Self-pay | Admitting: *Deleted

## 2015-11-06 ENCOUNTER — Ambulatory Visit (INDEPENDENT_AMBULATORY_CARE_PROVIDER_SITE_OTHER): Payer: Medicare Other | Admitting: *Deleted

## 2015-11-06 DIAGNOSIS — Z23 Encounter for immunization: Secondary | ICD-10-CM | POA: Diagnosis not present

## 2015-11-06 NOTE — Telephone Encounter (Signed)
[place]

## 2015-11-06 NOTE — Telephone Encounter (Signed)
Where specifically is he going?

## 2015-11-06 NOTE — Telephone Encounter (Signed)
[place] go [place] and the [place] is of [place]. He wonders what / if anything he should take with him for traveling.  He is leaving on Tuesday.

## 2015-11-06 NOTE — Telephone Encounter (Signed)
The elevation of [place] is 3800 feet above sea level. He should not need any medication for this elevation. High altitude sickness is especially worrisome when the altitude is greater than 8000 feet and the key to avoiding high-altitude sickness is to have a slow ascent, in other words not going up quickly.

## 2015-11-10 ENCOUNTER — Other Ambulatory Visit: Payer: Self-pay | Admitting: *Deleted

## 2015-11-10 MED ORDER — ROSUVASTATIN CALCIUM 10 MG PO TABS: ORAL_TABLET | ORAL | Status: DC

## 2015-11-10 NOTE — Telephone Encounter (Signed)
Dr. Sabra Heck talked to pt personally today, then talked to pharmacist prescribing Breo starting today before he leaves, then daily as needed for shortness of breath.

## 2015-11-10 NOTE — Telephone Encounter (Signed)
Stp and he is going to [place] not [place], the elevation of [place] is greater than 14,000 feet. He in the past has had headaches and other issues and would like to prevent it if at all possible and he is leaving in the morning. Please advise.

## 2015-11-10 NOTE — Telephone Encounter (Signed)
The locals have a peel which she can buy in pharmacies they are without prescription that is primarily caffeine that is the most effective medicine I took when I was and bulimia at that altitude. Otherwise what we use is sometimes an inhaler or a diuretic Acetasol in mind

## 2015-12-11 DIAGNOSIS — C61 Malignant neoplasm of prostate: Secondary | ICD-10-CM | POA: Diagnosis not present

## 2016-01-12 ENCOUNTER — Ambulatory Visit: Payer: Medicare Other | Admitting: Family Medicine

## 2016-01-27 ENCOUNTER — Ambulatory Visit: Payer: Medicare Other | Admitting: Family Medicine

## 2016-02-07 ENCOUNTER — Other Ambulatory Visit: Payer: Self-pay | Admitting: Family Medicine

## 2016-02-08 NOTE — Telephone Encounter (Signed)
Last lipid 06/16/15  DWM

## 2016-02-21 ENCOUNTER — Other Ambulatory Visit: Payer: Self-pay | Admitting: Family Medicine

## 2016-02-23 DIAGNOSIS — M25561 Pain in right knee: Secondary | ICD-10-CM | POA: Diagnosis not present

## 2016-03-28 DIAGNOSIS — M1711 Unilateral primary osteoarthritis, right knee: Secondary | ICD-10-CM | POA: Diagnosis not present

## 2016-04-04 DIAGNOSIS — M1711 Unilateral primary osteoarthritis, right knee: Secondary | ICD-10-CM | POA: Diagnosis not present

## 2016-04-11 DIAGNOSIS — M25561 Pain in right knee: Secondary | ICD-10-CM | POA: Diagnosis not present

## 2016-04-11 DIAGNOSIS — M1711 Unilateral primary osteoarthritis, right knee: Secondary | ICD-10-CM | POA: Diagnosis not present

## 2016-05-15 ENCOUNTER — Other Ambulatory Visit: Payer: Self-pay | Admitting: Neurology

## 2016-05-16 ENCOUNTER — Other Ambulatory Visit: Payer: Self-pay

## 2016-05-16 DIAGNOSIS — G4752 REM sleep behavior disorder: Secondary | ICD-10-CM

## 2016-05-16 DIAGNOSIS — G4759 Other parasomnia: Secondary | ICD-10-CM

## 2016-05-16 MED ORDER — QUETIAPINE FUMARATE 50 MG PO TABS: 50.0000 mg | ORAL_TABLET | Freq: Every day | ORAL | 3 refills | Status: DC

## 2016-05-16 NOTE — Telephone Encounter (Signed)
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 He is asking about the seroquel refill. I advised him that we have not received that request but I will go ahead and refill it.

## 2016-05-16 NOTE — Telephone Encounter (Signed)
RX for [facility] faxed to [facility] in [place]. Received a receipt of confirmation.

## 2016-05-17 NOTE — Telephone Encounter (Signed)
RX for seroquel faxed to [facility] in [place] Received a receipt of confirmation.

## 2016-05-19 ENCOUNTER — Ambulatory Visit (INDEPENDENT_AMBULATORY_CARE_PROVIDER_SITE_OTHER): Payer: Medicare Other | Admitting: Neurology

## 2016-05-19 ENCOUNTER — Encounter: Payer: Self-pay | Admitting: Neurology

## 2016-05-19 VITALS — BP 125/93 | HR 130 | Resp 16 | Ht 70.0 in | Wt 233.0 lb

## 2016-05-19 DIAGNOSIS — G478 Other sleep disorders: Secondary | ICD-10-CM

## 2016-05-19 DIAGNOSIS — G4752 REM sleep behavior disorder: Secondary | ICD-10-CM

## 2016-05-19 DIAGNOSIS — G4701 Insomnia due to medical condition: Secondary | ICD-10-CM | POA: Diagnosis not present

## 2016-05-19 DIAGNOSIS — G4759 Other parasomnia: Secondary | ICD-10-CM | POA: Insufficient documentation

## 2016-05-19 DIAGNOSIS — G4750 Parasomnia, unspecified: Secondary | ICD-10-CM

## 2016-05-19 MED ORDER — SUVOREXANT 15 MG PO TABS: 15.0000 mg | ORAL_TABLET | Freq: Every evening | ORAL | 2 refills | Status: DC | PRN

## 2016-05-19 MED ORDER — ESZOPICLONE 3 MG PO TABS: 3.0000 mg | ORAL_TABLET | Freq: Every day | ORAL | 0 refills | Status: DC

## 2016-05-19 MED ORDER — QUETIAPINE FUMARATE 50 MG PO TABS: 50.0000 mg | ORAL_TABLET | Freq: Every day | ORAL | 3 refills | Status: DC

## 2016-05-19 NOTE — Progress Notes (Signed)
[facility]  Provider:  Dr Oral Remache Referring Provider: Chipper Herb, MD Primary Care Physician:  Redge Gainer, MD  Chief Complaint  Patient presents with  . Follow-up    out of belsomra, stated that it did help.    Bonnita Levan    HPI:  Jalijah Koopmann is a 70 y.o. male here as a referral from Dr. Laurance Flatten for follow up on Parasomnia, treated with Klonopin.  There has again been no breakthrough activity of parasomnias on Klonopin.   [place] [address] FSS 40 .  05-19-2016.  The patient has been followed here for almost 12  years. He is an active traveler and has just traveled again through [place], [place] and [place].  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 As past medical history I need to add  that he had altitude sickness while (2013)  in [place], when he reached the 5000 m range. His  insomnia in high altitude was probably related to central apnea . The patient's bedtime is around 10 PM he arises at about 6:30 in the morning, he break spontaneously and does not need an alarm. We can speak days a fairly similar in sleep times he may have one bathroom break at night. There is no history of falls at night of dizziness or light headedness. He failed Doxepin and Ambien, needs to go back on Lunesta.   Review of Systems: Out of a complete 14 system review, the patient complains of only the following symptoms, and all other reviewed systems are negative. parasomnia , controlled.  No jet lag problem.   Social History   Social History  . Marital status: Divorced    Spouse name: N/A  . Number of children: 2  . Years of education: college   Occupational History  . 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 Smoking status: Never Smoker  . Smokeless tobacco: Never Used  . Alcohol use Yes     Comment: Social  . Drug use: No  . Sexual activity: Not on file   Other Topics Concern  . Not on file   Social History Narrative    Patient is a Tree surgeon, works full time at [employer]. Patient has a college education. Patient is divorced.  Lives alone   Patient is right-handed.   Patient drinks very little caffeine (tea).    Family History  Problem Relation Age of Onset  . Atrial fibrillation Mother   . Pneumonia Father   . Cancer Father     Past Medical History:  Diagnosis Date  . [facility])    Prostate, followed by urology  . Diverticulosis   . 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 Hyperlipidemia   . Hypothyroid   . Pulmonary nodules   . Spondylolysis   . Syncope and collapse     Past Surgical History:  Procedure Laterality Date  . KNEE SURGERY Bilateral   . UMBILICAL HERNIA REPAIR      Current Outpatient Prescriptions  Medication Sig Dispense Refill  . clonazePAM (KLONOPIN) 0.5 MG tablet 1/2 tab q HS po. 45 tablet 1  . Eszopiclone 3 MG TABS TAKE 1 TABLET BY MOUTH IMMEDIATELY BEFORE BEDTIME 90 tablet 0  . levothyroxine (SYNTHROID, LEVOTHROID) 50 MCG tablet TAKE 1 TABLET BY MOUTH DAILY 90 tablet 2  . QUEtiapine (SEROQUEL) 50 MG tablet Take 1 tablet (50 mg total) by mouth at bedtime. 90 tablet 3  .  rosuvastatin (CRESTOR) 10 MG tablet TAKE 1 TABLET BY MOUTH DAILY 30 tablet 0  . Suvorexant ([name]) 15 MG TABS Take 15 mg by mouth at bedtime as needed. (Patient not taking: Reported on 05/19/2016) 9 tablet 0   No current facility-administered medications for this visit.     Allergies as of 05/19/2016 - Review Complete 05/19/2016  Allergen Reaction Noted  . Levitra [vardenafil]  10/03/2013    Vitals: BP (!) 125/93   Pulse (!) 130   Resp 16   Ht 5\' 10"  (1.778 m)   Wt 233 lb (105.7 kg)   BMI 33.43 kg/m  Last Weight:  Wt Readings from Last 1 Encounters:  05/19/16 233 lb (105.7 kg)   Last Height:   Ht Readings from Last 1 Encounters:  05/19/16 5\' 10"  (1.778 m)   Vision Screening:  Left eye with correction.                                    Right eye  with correction 20/20.  Physical exam:  General: The patient is awake, alert and appears not in acute distress. The patient is well groomed. Head: Normocephalic, atraumatic. Neck is supple. Mallampati 2, neck circumference: 0000000 , TMJ click on the right.  Cardiovascular:  Regular rate and rhythm, Skin:  Without evidence of edema, or rash Trunk: BMI is elevated, and patient  has normal posture.  Neurologic exam : The patient is awake and alert, oriented to place and time.  Memory subjective  described as intact. There is a normal attention span & concentration ability.  Speech is fluent without  dysarthria, dysphonia or aphasia. Mood and affect are appropriate.  Cranial nerves:Pupils are equal and briskly reactive to light. Funduscopic exam without  evidence of pallor or edema.  Extraocular movements  in vertical and horizontal planes intact and without nystagmus.  Visual fields by finger perimetry are intact.Hearing to finger rub intact.  Facial sensation intact to fine touch- Facial motor strength is symmetric and tongue and uvula move midline.  Motor exam: Normal tone and normal muscle bulk and symmetric normal strength in all extremities. Sensory:  Fine touch, pinprick and vibration were tested in all extremities. Proprioception is  normal. Coordination: Rapid alternating movements in the fingers/hands is tested and normal.  Finger-to-nose normal without evidence of ataxia, dysmetria or tremor. Gait and station: Patient walks without assistive device  Strength within normal limits.  Stance is stable and normal. Tandem gait is unfragmented. Romberg testing is normal.  Deep tendon reflexes: in the  upper and lower extremities are symmetric and intact.  Babinski maneuver response is  downgoing.  Assessment:  After physical and neurologic examination, review of laboratory studies, and pre-existing records, assessment :   20 minute visit for refill, Lunesta, as he has failed Zolpidem and  Doxepin. : Tremor developed over the last 6 years , mild, and may be essential or Seroquel related, no parkinsonian changes to tone and posture. No masked face.   Plan:  Treatment plan and additional workup will be reviewed under Problem List.  Refilled Seroquel/ Lunesta for the next year.  He liked [name] , but on medicare was not able to get the medication at the promotional price.  Klonopin is prescribed by Dr Laurance Flatten.   He is prn using Lunesta.

## 2016-05-19 NOTE — Addendum Note (Signed)
Addended by: Larey Seat on: 05/19/2016 11:39 AM   Modules accepted: Orders

## 2016-05-19 NOTE — Patient Instructions (Signed)
Suvorexant oral tablets What is this medicine? SUVOREXANT (su-vor-EX-ant) is used to treat insomnia. This medicine helps you to fall asleep and sleep through the night. This medicine may be used for other purposes; ask your health care provider or pharmacist if you have questions. What should I tell my health care provider before I take this medicine? They need to know if you have any of these conditions: -depression -history of a drug or alcohol abuse problem -history of daytime sleepiness -history of sudden onset of muscle weakness (cataplexy) -liver disease -lung or breathing disease -narcolepsy -suicidal thoughts, plans, or attempt; a previous suicide attempt by you or a family member -an unusual or allergic reaction to suvorexant, other medicines, foods, dyes, or preservatives -pregnant or trying to get pregnant -breast-feeding How should I use this medicine? Take this medicine by mouth within 30 minutes of going to bed. Do not take it unless you are able to stay in bed a full night before you must be active again. Follow the directions on the prescription label. For best results, it is better to take this medicine on an empty stomach. Do not take your medicine more often than directed. Do not stop taking this medicine on your own. Always follow your doctor or health care professional's advice. A special MedGuide will be given to you by the pharmacist with each prescription and refill. Be sure to read this information carefully each time. Talk to your pediatrician regarding the use of this medicine in children. Special care may be needed. Overdosage: If you think you have taken too much of this medicine contact a poison control center or emergency room at once. NOTE: This medicine is only for you. Do not share this medicine with others. What if I miss a dose? This medicine should only be taken immediately before going to sleep. Do not take double or extra doses. What may interact with  this medicine? -alcohol -antiviral medicines for HIV or AIDS -aprepitant -carbamazepine -certain antibiotics like ciprofloxacin, clarithromycin, erythromycin, telithromycin -certain medicines for depression or psychotic disturbances -certain medicines for fungal infections like ketoconazole, posaconazole, fluconazole, or itraconazole -conivaptan -digoxin -diltiazem -grapefruit juice -imatinib -medicines for anxiety or sleep -phenytoin -rifampin -verapamil This list may not describe all possible interactions. Give your health care provider a list of all the medicines, herbs, non-prescription drugs, or dietary supplements you use. Also tell them if you smoke, drink alcohol, or use illegal drugs. Some items may interact with your medicine. What should I watch for while using this medicine? Visit your doctor or health care professional for regular checks on your progress. Keep a regular sleep schedule by going to bed at about the same time each night. Avoid caffeine-containing drinks in the evening hours. When sleep medicines are used every night for more than a few weeks, they may stop working. Do not increase the dose on your own. Talk to your doctor if your insomnia worsens or is not better within 7 to 10 days. After taking this medicine for sleep, you may get up out of bed while not being fully awake and do an activity that you do not know you are doing. The next morning, you may have no memory of the event. Activities such as driving a car ("sleep-driving"), making and eating food, talking on the phone, sexual activity, and sleep-walking have been reported. Call your doctor right away if you find out you have done any of these activities. Do not take this medicine if you have used alcohol that evening   or before bed or taken another medicine for sleep, since your risk of doing these sleep-related activities will be increased. Do not take this medicine unless you are able to stay in bed for a  full night (7 to 8 hours) and do not drive or perform other activities requiring full alertness within 8 hours of a dose. Do not drive, use machinery, or do anything that needs mental alertness the day after you take the 20 mg dose of this medicine. The use of lower doses (10 mg) also has the potential to cause driving impairment the next day. You may have a decrease in mental alertness the day after use, even if you feel that you are fully awake. Tell your doctor if you will need to perform activities requiring full alertness, such as driving, the next day. Do not stand or sit up quickly after taking this medicine, especially if you are an older patient. This reduces the risk of dizzy or fainting spells. If you or your family notice any changes in your behavior, such as new or worsening depression, thoughts of harming yourself, anxiety, other unusual or disturbing thoughts, or memory loss, call your doctor right away. What side effects may I notice from receiving this medicine? Side effects that you should report to your doctor or health care professional as soon as possible: -allergic reactions like skin rash, itching or hives, swelling of the face, lips, or tongue -confusion -depressed mood -feeling faint or lightheaded, falls -hallucinations -inability to move or speak for up to several minutes while you are going to sleep or waking up -memory loss -periods of leg weakness lasting from seconds to a few minutes -problems with balance, speaking, walking -restlessness, excitability, or feelings of agitation -unusual activities while asleep like driving, eating, making phone calls Side effects that usually do not require medical attention (Report these to your doctor or health care professional if they continue or are bothersome.): -abnormal dreams -daytime drowsiness -diarrhea -dizziness -headache This list may not describe all possible side effects. Call your doctor for medical advice about  side effects. You may report side effects to FDA at 1-800-FDA-1088. Where should I keep my medicine? Keep out of the reach of children. This medicine can be abused. Keep your medicine in a safe place to protect it from theft. Do not share this medicine with anyone. Selling or giving away this medicine is dangerous and against the law. Store at room temperature between 15 and 30 degrees C (59 and 86 degrees F). Throw away any unused medicine after the expiration date. NOTE: This sheet is a summary. It may not cover all possible information. If you have questions about this medicine, talk to your doctor, pharmacist, or health care provider.    2016, Elsevier/Gold Standard. (2015-03-23 13:22:51)  

## 2016-05-26 ENCOUNTER — Ambulatory Visit: Payer: Medicare Other | Admitting: Family Medicine

## 2016-05-31 ENCOUNTER — Encounter: Payer: Self-pay | Admitting: Family Medicine

## 2016-05-31 ENCOUNTER — Ambulatory Visit (INDEPENDENT_AMBULATORY_CARE_PROVIDER_SITE_OTHER): Payer: Medicare Other | Admitting: Family Medicine

## 2016-05-31 ENCOUNTER — Ambulatory Visit: Payer: Medicare Other | Admitting: Family Medicine

## 2016-05-31 ENCOUNTER — Other Ambulatory Visit: Payer: Medicare Other

## 2016-05-31 VITALS — BP 138/88 | HR 141 | Temp 97.3°F | Ht 70.0 in | Wt 235.0 lb

## 2016-05-31 DIAGNOSIS — E785 Hyperlipidemia, unspecified: Secondary | ICD-10-CM

## 2016-05-31 DIAGNOSIS — E039 Hypothyroidism, unspecified: Secondary | ICD-10-CM

## 2016-05-31 DIAGNOSIS — I080 Rheumatic disorders of both mitral and aortic valves: Secondary | ICD-10-CM | POA: Diagnosis not present

## 2016-05-31 DIAGNOSIS — R0989 Other specified symptoms and signs involving the circulatory and respiratory systems: Secondary | ICD-10-CM | POA: Diagnosis not present

## 2016-05-31 DIAGNOSIS — Z888 Allergy status to other drugs, medicaments and biological substances status: Secondary | ICD-10-CM | POA: Diagnosis not present

## 2016-05-31 DIAGNOSIS — I4891 Unspecified atrial fibrillation: Secondary | ICD-10-CM

## 2016-05-31 DIAGNOSIS — R002 Palpitations: Secondary | ICD-10-CM | POA: Diagnosis not present

## 2016-05-31 DIAGNOSIS — R000 Tachycardia, unspecified: Secondary | ICD-10-CM | POA: Diagnosis not present

## 2016-05-31 DIAGNOSIS — I4892 Unspecified atrial flutter: Secondary | ICD-10-CM | POA: Diagnosis not present

## 2016-05-31 DIAGNOSIS — Z7901 Long term (current) use of anticoagulants: Secondary | ICD-10-CM | POA: Diagnosis not present

## 2016-05-31 DIAGNOSIS — R079 Chest pain, unspecified: Secondary | ICD-10-CM | POA: Diagnosis not present

## 2016-05-31 DIAGNOSIS — E559 Vitamin D deficiency, unspecified: Secondary | ICD-10-CM

## 2016-05-31 DIAGNOSIS — Z139 Encounter for screening, unspecified: Secondary | ICD-10-CM

## 2016-05-31 DIAGNOSIS — I350 Nonrheumatic aortic (valve) stenosis: Secondary | ICD-10-CM | POA: Diagnosis not present

## 2016-05-31 DIAGNOSIS — R030 Elevated blood-pressure reading, without diagnosis of hypertension: Secondary | ICD-10-CM | POA: Diagnosis not present

## 2016-05-31 DIAGNOSIS — Z8546 Personal history of malignant neoplasm of prostate: Secondary | ICD-10-CM

## 2016-05-31 DIAGNOSIS — E663 Overweight: Secondary | ICD-10-CM | POA: Diagnosis not present

## 2016-05-31 DIAGNOSIS — R0789 Other chest pain: Secondary | ICD-10-CM | POA: Diagnosis not present

## 2016-05-31 DIAGNOSIS — Z6832 Body mass index (BMI) 32.0-32.9, adult: Secondary | ICD-10-CM | POA: Diagnosis not present

## 2016-05-31 DIAGNOSIS — IMO0001 Reserved for inherently not codable concepts without codable children: Secondary | ICD-10-CM

## 2016-05-31 DIAGNOSIS — R0602 Shortness of breath: Secondary | ICD-10-CM | POA: Diagnosis not present

## 2016-05-31 LAB — URINALYSIS, COMPLETE
Bilirubin, UA: NEGATIVE
Glucose, UA: NEGATIVE
KETONES UA: NEGATIVE
LEUKOCYTES UA: NEGATIVE
Nitrite, UA: NEGATIVE
PH UA: 5 (ref 5.0–7.5)
Protein, UA: NEGATIVE
SPEC GRAV UA: 1.01 (ref 1.005–1.030)
Urobilinogen, Ur: 0.2 mg/dL (ref 0.2–1.0)

## 2016-05-31 LAB — MICROSCOPIC EXAMINATION
BACTERIA UA: NONE SEEN
EPITHELIAL CELLS (NON RENAL): NONE SEEN /HPF (ref 0–10)

## 2016-05-31 NOTE — Progress Notes (Signed)
Subjective:    Patient ID: Benjamin Maxwell, male    DOB: [birth year], 70 y.o.   MRN: [id number]  HPI Patient here today for elevated Heart Rate. Patient is having no symptoms of chest pain shortness of breath or nausea. He relates that he went to see his neurologist about 2 weeks ago and that she told him his heart rate was in 133. Since he was not having any symptoms he waited until his appointment today to come to see [place] to tell us about the fast heart rate. We did an EKG that shows atrial fib flutter and possibly a recent inferior MI. Once again the patient describes no history of chest pain or shortness of breath. He's had no nausea vomiting diarrhea or problems with his bowel movements or blood in the stool.    Patient Active Problem List   Diagnosis Date Noted  . 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 Obesity 11/24/2014  . Vitamin D deficiency 11/24/2014  . 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 Insomnia 10/03/2013  . prostate cancer 10/03/2013  . Hyperlipidemia 10/03/2013  . Hypothyroidism 10/03/2013  . Dysfunctions associated with sleep stages or arousal from sleep    Outpatient Encounter Prescriptions as of 05/31/2016  Medication Sig  . Cholecalciferol (VITAMIN D-1000 MAX ST) 1000 units tablet Take by mouth.  . clonazePAM (KLONOPIN) 0.5 MG tablet 1/2 tab q HS po.  . Eszopiclone 3 MG TABS Take 1 tablet (3 mg total) by mouth at bedtime. Take immediately before bedtime  . levothyroxine (SYNTHROID, LEVOTHROID) 50 MCG tablet TAKE 1 TABLET BY MOUTH DAILY  . Multiple Vitamin (MULTI-VITAMINS) TABS Take by mouth.  . Omega-3 1000 MG CAPS Take 1 g by mouth.  . QUEtiapine (SEROQUEL) 50 MG tablet Take 1 tablet (50 mg total) by mouth at bedtime.  . rosuvastatin (CRESTOR) 10 MG tablet TAKE 1 TABLET BY MOUTH DAILY  . Suvorexant ([name]) 15 MG TABS Take 15 mg by mouth at bedtime as needed. (Patient not taking: Reported on 05/31/2016)   No  facility-administered encounter medications on file as of 05/31/2016.      Review of Systems  Constitutional: Negative.   HENT: Negative.   Eyes: Negative.   Respiratory: Negative.  Negative for chest tightness.   Cardiovascular: Negative.  Negative for chest pain, palpitations and leg swelling.       Elevated HR  Gastrointestinal: Negative.   Endocrine: Negative.   Genitourinary: Negative.   Musculoskeletal: Negative.   Skin: Negative.   Allergic/Immunologic: Negative.   Neurological: Negative.   Hematological: Negative.   Psychiatric/Behavioral: Negative.        Objective:   Physical Exam  Constitutional: He is oriented to person, place, and time. He appears well-developed and well-nourished. No distress.  The patient is alert and talkative and smiling.  HENT:  Head: Normocephalic and atraumatic.  Mouth/Throat: No oropharyngeal exudate.  Eyes: Conjunctivae and EOM are normal. Pupils are equal, round, and reactive to light. Right eye exhibits no discharge. Left eye exhibits no discharge. No scleral icterus.  Neck: Normal range of motion. Neck supple. No thyromegaly present.  No bruits or thyromegaly  Cardiovascular: Normal rate and intact distal pulses.   No murmur heard. The heart is irregular irregular at 120-1 40/m  Pulmonary/Chest: Effort normal and breath sounds normal. He has no wheezes. He has no rales. He exhibits no tenderness.  Abdominal: Soft. Bowel sounds are normal. He exhibits no mass. There is no tenderness. There is no  rebound and no guarding.  Musculoskeletal: Normal range of motion. He exhibits no edema.  Lymphadenopathy:    He has no cervical adenopathy.  Neurological: He is alert and oriented to person, place, and time. He has normal reflexes. No cranial nerve deficit.  Skin: Skin is warm and dry. No rash noted.  Psychiatric: He has a normal mood and affect. His behavior is normal. Judgment and thought content normal.  Nursing note and vitals  reviewed.  BP 138/88 (BP Location: Left Arm)   Pulse (!) 141   Temp 97.3 F (36.3 C) (Oral)   Ht 5\' 10"  (1.778 m)   Wt 235 lb (106.6 kg)   BMI 33.72 kg/m   EKG: Atrial fib flutter with what could be an recent inferior and repeat her     Assessment & Plan:  1. Elevated heart rate and blood pressure -The patient has been noticing this is long as 3 weeks ago. It was documented 2 weeks ago by another physician. Because of the atrial fibrillation flutter and a possible recent inferior injury we request the patient go by ambulance or by a friend and he refuses to do this. I will speak to the cardiologist so that he can be seen in the emergency room today and followed up on this irregular rhythm and abnormal EKG - EKG 12-Lead - DG Chest 2 View; Future  2. Atrial fibrillation, unspecified type [facility]) -This is actually atrial fibrillation/flutter with a possible recent inferior injury and he will be referred immediately to for site Hospital and a cardiologist there.  3. Hyperlipidemia -He had lab work done today for a pending physical exam by me which has not been done.  4. Hypothyroidism, unspecified hypothyroidism type -Lab work today for an impending physical  Meds ordered this encounter  Medications  . Multiple Vitamin (MULTI-VITAMINS) TABS    Sig: Take by mouth.  . Omega-3 1000 MG CAPS    Sig: Take 1 g by mouth.  . Cholecalciferol (VITAMIN D-1000 MAX ST) 1000 units tablet    Sig: Take by mouth.   Patient Instructions  We will call a cardiologist office in [facility] to make arrangements for him to be seen there as soon as possible Patient refuses at my advice to have someone transport him there as he would prefer to drive himself. I requested this on several occasions.  Arrie Senate MD

## 2016-05-31 NOTE — Patient Instructions (Signed)
We will call a cardiologist office in [facility] to make arrangements for him to be seen there as soon as possible Patient refuses at my advice to have someone transport him there as he would prefer to drive himself. I requested this on several occasions.

## 2016-06-01 DIAGNOSIS — R000 Tachycardia, unspecified: Secondary | ICD-10-CM | POA: Diagnosis not present

## 2016-06-01 DIAGNOSIS — E785 Hyperlipidemia, unspecified: Secondary | ICD-10-CM | POA: Diagnosis not present

## 2016-06-01 DIAGNOSIS — I4891 Unspecified atrial fibrillation: Secondary | ICD-10-CM | POA: Diagnosis not present

## 2016-06-01 DIAGNOSIS — I4892 Unspecified atrial flutter: Secondary | ICD-10-CM | POA: Diagnosis not present

## 2016-06-01 DIAGNOSIS — I080 Rheumatic disorders of both mitral and aortic valves: Secondary | ICD-10-CM | POA: Diagnosis not present

## 2016-06-01 DIAGNOSIS — R079 Chest pain, unspecified: Secondary | ICD-10-CM | POA: Diagnosis not present

## 2016-06-01 DIAGNOSIS — E039 Hypothyroidism, unspecified: Secondary | ICD-10-CM | POA: Diagnosis not present

## 2016-06-01 LAB — BMP8+EGFR
BUN/Creatinine Ratio: 13 (ref 10–24)
BUN: 13 mg/dL (ref 8–27)
CALCIUM: 9.2 mg/dL (ref 8.6–10.2)
CO2: 23 mmol/L (ref 18–29)
Chloride: 99 mmol/L (ref 96–106)
Creatinine, Ser: 0.98 mg/dL (ref 0.76–1.27)
GFR calc Af Amer: 92 mL/min/{1.73_m2} (ref >59)
GFR, EST NON AFRICAN AMERICAN: 79 mL/min/{1.73_m2} (ref >59)
Glucose: 98 mg/dL (ref 65–99)
Potassium: 4.2 mmol/L (ref 3.5–5.2)
SODIUM: 137 mmol/L (ref 134–144)

## 2016-06-01 LAB — CBC WITH DIFFERENTIAL/PLATELET
Basophils Absolute: 0 10*3/uL (ref 0.0–0.2)
Basos: 0 %
EOS (ABSOLUTE): 0.2 10*3/uL (ref 0.0–0.4)
EOS: 2 %
HEMATOCRIT: 44 % (ref 37.5–51.0)
Hemoglobin: 14.2 g/dL (ref 12.6–17.7)
IMMATURE GRANS (ABS): 0 10*3/uL (ref 0.0–0.1)
IMMATURE GRANULOCYTES: 0 %
LYMPHS: 36 %
Lymphocytes Absolute: 3.4 10*3/uL — ABNORMAL HIGH (ref 0.7–3.1)
MCH: 30.3 pg (ref 26.6–33.0)
MCHC: 32.3 g/dL (ref 31.5–35.7)
MCV: 94 fL (ref 79–97)
MONOS ABS: 0.5 10*3/uL (ref 0.1–0.9)
Monocytes: 5 %
NEUTROS PCT: 57 %
Neutrophils Absolute: 5.4 10*3/uL (ref 1.4–7.0)
PLATELETS: 215 10*3/uL (ref 150–379)
RBC: 4.69 x10E6/uL (ref 4.14–5.80)
RDW: 13.8 % (ref 12.3–15.4)
WBC: 9.6 10*3/uL (ref 3.4–10.8)

## 2016-06-01 LAB — NMR, LIPOPROFILE
Cholesterol: 165 mg/dL (ref 100–199)
HDL CHOLESTEROL BY NMR: 61 mg/dL (ref >39)
HDL PARTICLE NUMBER: 46.1 umol/L (ref >=30.5)
LDL Particle Number: 1104 nmol/L — ABNORMAL HIGH (ref <1000)
LDL Size: 20.1 nm (ref >20.5)
LDL-C: 65 mg/dL (ref 0–99)
LP-IR Score: 72 — ABNORMAL HIGH (ref <=45)
SMALL LDL PARTICLE NUMBER: 761 nmol/L — AB (ref <=527)
TRIGLYCERIDES BY NMR: 193 mg/dL — AB (ref 0–149)

## 2016-06-01 LAB — THYROID PANEL WITH TSH
FREE THYROXINE INDEX: 2.2 (ref 1.2–4.9)
T3 Uptake Ratio: 28 % (ref 24–39)
T4, Total: 7.7 ug/dL (ref 4.5–12.0)
TSH: 1.94 u[IU]/mL (ref 0.450–4.500)

## 2016-06-01 LAB — HEPATIC FUNCTION PANEL
ALBUMIN: 4.3 g/dL (ref 3.6–4.8)
ALT: 19 IU/L (ref 0–44)
AST: 21 IU/L (ref 0–40)
Alkaline Phosphatase: 53 IU/L (ref 39–117)
BILIRUBIN TOTAL: 0.8 mg/dL (ref 0.0–1.2)
BILIRUBIN, DIRECT: 0.2 mg/dL (ref 0.00–0.40)
Total Protein: 7.3 g/dL (ref 6.0–8.5)

## 2016-06-01 LAB — VITAMIN D 25 HYDROXY (VIT D DEFICIENCY, FRACTURES): Vit D, 25-Hydroxy: 45.2 ng/mL (ref 30.0–100.0)

## 2016-06-01 LAB — HEPATITIS C ANTIBODY

## 2016-06-14 ENCOUNTER — Telehealth: Payer: Self-pay | Admitting: *Deleted

## 2016-06-14 DIAGNOSIS — G4750 Parasomnia, unspecified: Secondary | ICD-10-CM

## 2016-06-14 DIAGNOSIS — G4759 Other parasomnia: Secondary | ICD-10-CM

## 2016-06-14 DIAGNOSIS — G4752 REM sleep behavior disorder: Secondary | ICD-10-CM

## 2016-06-14 DIAGNOSIS — G4701 Insomnia due to medical condition: Secondary | ICD-10-CM

## 2016-06-14 NOTE — Telephone Encounter (Signed)
LMVM for pt that received request from pharmacy ([facility] about belsomra prescription).   I called and LMVM for him to return call relating to strength.  He was given samples of this.

## 2016-06-14 NOTE — Telephone Encounter (Signed)
Pt called back and says at that time Dr Krista Blue gave him samples, 3 different ones. He thinks it was 10 mg once a day.

## 2016-06-15 MED ORDER — SUVOREXANT 15 MG PO TABS: 15.0000 mg | ORAL_TABLET | Freq: Every evening | ORAL | 2 refills | Status: DC | PRN

## 2016-06-16 ENCOUNTER — Telehealth: Payer: Self-pay

## 2016-06-16 NOTE — Telephone Encounter (Signed)
Received verbal order from Dr. Brett Fairy for pt's belsomra 15mg . Faxed to [facility]. Received a receipt of confirmation.

## 2016-06-16 NOTE — Telephone Encounter (Signed)
I completed the pa for belsomra 15mg  for pt and sent to [facility]. Should have a determination in 1-3 business days.

## 2016-06-27 NOTE — Telephone Encounter (Signed)
[employer] approved coverage on belsomra 15mg  tablets until 06/16/2017.

## 2016-06-29 ENCOUNTER — Telehealth: Payer: Self-pay | Admitting: *Deleted

## 2016-06-29 NOTE — Telephone Encounter (Signed)
Skin in touch with the patient's cardiologist and tell him of what is going on with Gus. He may ask [place] just to call in some metoprolol for the patient where he is until he can get back to see him on Monday but talk to the cardiologist first

## 2016-06-29 NOTE — Telephone Encounter (Signed)
I received an email from the patient around 1:30pm today. Pt is out of state traveling the [place] and will not return until Saturday. Pt reports that he has been hiking and feeling really tired. He has a wrist monitor and noticed that his heart rate is running 140-145 bpm. The only symptom that he has had is that he has been tired but he thought it was due to the elevation change. No chest pain or SOB. He is asking for advice and suggestions to what he can do or should do. He has a 4 week follow up with his cardiologist this Monday. I reviewed his notes from [facility] and he was on a cardizem drip and was cardioverted with 200J. He was discharged on Eliquis 5 mg BID. I do not see that he is on any type of antiarrhythmic

## 2016-06-30 NOTE — Telephone Encounter (Signed)
Spoke with nurse at [facility] -- she will call the pt and speak with the cardiologist about starting the pt on something for rate control.

## 2016-06-30 NOTE — Telephone Encounter (Signed)
I've left a message with Dr Renold Genta nurse at [facility] cardiology.

## 2016-07-04 DIAGNOSIS — I4892 Unspecified atrial flutter: Secondary | ICD-10-CM | POA: Diagnosis not present

## 2016-07-04 DIAGNOSIS — R9431 Abnormal electrocardiogram [ECG] [EKG]: Secondary | ICD-10-CM | POA: Diagnosis not present

## 2016-07-12 DIAGNOSIS — I4892 Unspecified atrial flutter: Secondary | ICD-10-CM | POA: Diagnosis not present

## 2016-07-12 DIAGNOSIS — M25561 Pain in right knee: Secondary | ICD-10-CM | POA: Diagnosis not present

## 2016-07-13 ENCOUNTER — Ambulatory Visit: Payer: Medicare Other | Admitting: Family Medicine

## 2016-07-14 ENCOUNTER — Other Ambulatory Visit: Payer: Self-pay | Admitting: Family Medicine

## 2016-07-19 DIAGNOSIS — I4892 Unspecified atrial flutter: Secondary | ICD-10-CM | POA: Diagnosis not present

## 2016-07-19 DIAGNOSIS — I351 Nonrheumatic aortic (valve) insufficiency: Secondary | ICD-10-CM | POA: Diagnosis not present

## 2016-07-19 DIAGNOSIS — Q231 Congenital insufficiency of aortic valve: Secondary | ICD-10-CM | POA: Diagnosis not present

## 2016-07-26 DIAGNOSIS — D71 Functional disorders of polymorphonuclear neutrophils: Secondary | ICD-10-CM | POA: Diagnosis not present

## 2016-07-26 DIAGNOSIS — Q231 Congenital insufficiency of aortic valve: Secondary | ICD-10-CM | POA: Diagnosis not present

## 2016-09-01 ENCOUNTER — Other Ambulatory Visit: Payer: Self-pay | Admitting: Neurology

## 2016-09-01 DIAGNOSIS — G4701 Insomnia due to medical condition: Secondary | ICD-10-CM

## 2016-09-01 DIAGNOSIS — I483 Typical atrial flutter: Secondary | ICD-10-CM | POA: Diagnosis not present

## 2016-09-01 DIAGNOSIS — G4750 Parasomnia, unspecified: Secondary | ICD-10-CM

## 2016-09-01 DIAGNOSIS — G4759 Other parasomnia: Secondary | ICD-10-CM

## 2016-09-01 DIAGNOSIS — G4752 REM sleep behavior disorder: Secondary | ICD-10-CM

## 2016-09-02 ENCOUNTER — Ambulatory Visit (INDEPENDENT_AMBULATORY_CARE_PROVIDER_SITE_OTHER): Payer: Medicare Other

## 2016-09-02 ENCOUNTER — Other Ambulatory Visit: Payer: Self-pay | Admitting: *Deleted

## 2016-09-02 ENCOUNTER — Other Ambulatory Visit: Payer: Medicare Other

## 2016-09-02 DIAGNOSIS — E559 Vitamin D deficiency, unspecified: Secondary | ICD-10-CM

## 2016-09-02 DIAGNOSIS — E7800 Pure hypercholesterolemia, unspecified: Secondary | ICD-10-CM | POA: Diagnosis not present

## 2016-09-02 DIAGNOSIS — Z23 Encounter for immunization: Secondary | ICD-10-CM | POA: Diagnosis not present

## 2016-09-02 DIAGNOSIS — C61 Malignant neoplasm of prostate: Secondary | ICD-10-CM

## 2016-09-02 DIAGNOSIS — E039 Hypothyroidism, unspecified: Secondary | ICD-10-CM

## 2016-09-02 LAB — URINALYSIS, COMPLETE
Bilirubin, UA: NEGATIVE
GLUCOSE, UA: NEGATIVE
Ketones, UA: NEGATIVE
Nitrite, UA: NEGATIVE
PROTEIN UA: NEGATIVE
Specific Gravity, UA: 1.005 (ref 1.005–1.030)
Urobilinogen, Ur: 0.2 mg/dL (ref 0.2–1.0)
pH, UA: 5 (ref 5.0–7.5)

## 2016-09-02 LAB — MICROSCOPIC EXAMINATION
Epithelial Cells (non renal): NONE SEEN /hpf (ref 0–10)
Renal Epithel, UA: NONE SEEN /hpf

## 2016-09-03 LAB — NMR, LIPOPROFILE
Cholesterol: 172 mg/dL (ref 100–199)
HDL Cholesterol by NMR: 52 mg/dL (ref >39)
HDL Particle Number: 38.1 umol/L (ref >=30.5)
LDL PARTICLE NUMBER: 1100 nmol/L — AB (ref <1000)
LDL Size: 20.7 nm (ref >20.5)
LDL-C: 87 mg/dL (ref 0–99)
LP-IR SCORE: 65 — AB (ref <=45)
SMALL LDL PARTICLE NUMBER: 264 nmol/L (ref <=527)
TRIGLYCERIDES BY NMR: 165 mg/dL — AB (ref 0–149)

## 2016-09-03 LAB — HEPATIC FUNCTION PANEL
ALBUMIN: 4.1 g/dL (ref 3.6–4.8)
ALK PHOS: 62 IU/L (ref 39–117)
ALT: 23 IU/L (ref 0–44)
AST: 20 IU/L (ref 0–40)
BILIRUBIN TOTAL: 0.7 mg/dL (ref 0.0–1.2)
BILIRUBIN, DIRECT: 0.19 mg/dL (ref 0.00–0.40)
TOTAL PROTEIN: 7.5 g/dL (ref 6.0–8.5)

## 2016-09-03 LAB — CBC WITH DIFFERENTIAL/PLATELET
BASOS ABS: 0 10*3/uL (ref 0.0–0.2)
BASOS: 0 %
EOS (ABSOLUTE): 0.2 10*3/uL (ref 0.0–0.4)
Eos: 3 %
Hematocrit: 44.1 % (ref 37.5–51.0)
Hemoglobin: 14.6 g/dL (ref 12.6–17.7)
IMMATURE GRANULOCYTES: 0 %
Immature Grans (Abs): 0 10*3/uL (ref 0.0–0.1)
Lymphocytes Absolute: 3.3 10*3/uL — ABNORMAL HIGH (ref 0.7–3.1)
Lymphs: 37 %
MCH: 30.1 pg (ref 26.6–33.0)
MCHC: 33.1 g/dL (ref 31.5–35.7)
MCV: 91 fL (ref 79–97)
MONOS ABS: 0.7 10*3/uL (ref 0.1–0.9)
Monocytes: 7 %
NEUTROS PCT: 53 %
Neutrophils Absolute: 4.8 10*3/uL (ref 1.4–7.0)
PLATELETS: 329 10*3/uL (ref 150–379)
RBC: 4.85 x10E6/uL (ref 4.14–5.80)
RDW: 13.8 % (ref 12.3–15.4)
WBC: 9 10*3/uL (ref 3.4–10.8)

## 2016-09-03 LAB — BMP8+EGFR
BUN/Creatinine Ratio: 15 (ref 10–24)
BUN: 13 mg/dL (ref 8–27)
CO2: 26 mmol/L (ref 18–29)
CREATININE: 0.86 mg/dL (ref 0.76–1.27)
Calcium: 9.8 mg/dL (ref 8.6–10.2)
Chloride: 99 mmol/L (ref 96–106)
GFR calc Af Amer: 104 mL/min/{1.73_m2} (ref >59)
GFR, EST NON AFRICAN AMERICAN: 90 mL/min/{1.73_m2} (ref >59)
GLUCOSE: 90 mg/dL (ref 65–99)
Potassium: 5.1 mmol/L (ref 3.5–5.2)
SODIUM: 140 mmol/L (ref 134–144)

## 2016-09-03 LAB — VITAMIN D 25 HYDROXY (VIT D DEFICIENCY, FRACTURES): Vit D, 25-Hydroxy: 41.4 ng/mL (ref 30.0–100.0)

## 2016-09-03 LAB — PSA (REFLEX TO FREE) (SERIAL): Prostate Specific Ag, Serum: 1 ng/mL (ref 0.0–4.0)

## 2016-09-05 ENCOUNTER — Encounter: Payer: Self-pay | Admitting: Family Medicine

## 2016-09-05 ENCOUNTER — Ambulatory Visit (INDEPENDENT_AMBULATORY_CARE_PROVIDER_SITE_OTHER): Payer: Medicare Other | Admitting: Family Medicine

## 2016-09-05 VITALS — BP 120/71 | HR 71 | Temp 97.2°F | Ht 70.0 in | Wt 232.0 lb

## 2016-09-05 DIAGNOSIS — E7800 Pure hypercholesterolemia, unspecified: Secondary | ICD-10-CM | POA: Diagnosis not present

## 2016-09-05 DIAGNOSIS — E559 Vitamin D deficiency, unspecified: Secondary | ICD-10-CM

## 2016-09-05 DIAGNOSIS — C61 Malignant neoplasm of prostate: Secondary | ICD-10-CM | POA: Insufficient documentation

## 2016-09-05 DIAGNOSIS — I4891 Unspecified atrial fibrillation: Secondary | ICD-10-CM | POA: Diagnosis not present

## 2016-09-05 MED ORDER — ESZOPICLONE 3 MG PO TABS: ORAL_TABLET | ORAL | 0 refills | Status: DC

## 2016-09-05 NOTE — Patient Instructions (Addendum)
Medicare Annual Wellness Visit  [facility] and the medical providers at [facility] strive to bring you the best medical care.  In doing so we not only want to address your current medical conditions and concerns but also to detect new conditions early and prevent illness, disease and health-related problems.    Medicare offers a yearly Wellness Visit which allows our clinical staff to assess your need for preventative services including immunizations, lifestyle education, counseling to decrease risk of preventable diseases and screening for fall risk and other medical concerns.    This visit is provided free of charge (no copay) for all Medicare recipients. The clinical pharmacists at [facility] have begun to conduct these Wellness Visits which will also include a thorough review of all your medications.    As you primary medical provider recommend that you make an appointment for your Annual Wellness Visit if you have not done so already this year.  You may set up this appointment before you leave today or you may call back [phone] and schedule an appointment.  Please make sure when you call that you mention that you are scheduling your Annual Wellness Visit with the clinical pharmacist so that the appointment may be made for the proper length of time.     Continue current medications. Continue good therapeutic lifestyle changes which include good diet and exercise. Fall precautions discussed with patient. If an FOBT was given today- please return it to our front desk. If you are over 10 years old - you may need Prevnar 32 or the adult Pneumonia vaccine.  **Flu shots are available--- please call and schedule a FLU-CLINIC appointment**  After your visit with [place] today you will receive a survey in the mail or online from [employer] regarding your care with [place]. Please take a moment to fill this out. Your feedback is very  important to [place] as you can help [place] better understand your patient needs as well as improve your experience and satisfaction. WE CARE ABOUT YOU!!!   Follow-up with urology as planned and cardiology as planned Take a copy of your lab work with you to those visits Make every effort possible to lose weight Consider trying a Physiological scientist

## 2016-09-05 NOTE — Addendum Note (Signed)
Addended byOliver Hum on: 09/05/2016 09:29 AM   Modules accepted: Orders

## 2016-09-05 NOTE — Telephone Encounter (Signed)
Received fax confirmation [phone]. Eszopiclone.

## 2016-09-05 NOTE — Progress Notes (Signed)
Subjective:    Patient ID: Benjamin Maxwell, male    DOB: Oct 03, 1946, 70 y.o.   MRN: [id number]  HPI Pt here for follow up and management of chronic medical problems which includes hyperlipidemia and a fib. He is taking medications regularly.The patient is being followed regularly by the cardiologist. He is scheduled for a heart ablation in January. He's had lab work recently and this will be reviewed with him during the visit today. He has no specific complaints. He does have a history of prostate cancer which is being followed by the urologist. She denies any chest pain or palpitations or lightheadedness. When he had the first episode of atrial fibrillation on a trip out [place] he did have lightheadedness. Since then he has had none. He denies any shortness of breath. He has no trouble with swallowing his food heartburn indigestion nausea vomiting diarrhea or blood in the stool. His last colonoscopy was in August 2013. He has no trouble passing his water burning pain or frequency or blood in the urine. His next visit with his urologist is coming up sometime in January.   Patient Active Problem List   Diagnosis Date Noted  . 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 Obesity 11/24/2014  . Vitamin D deficiency 11/24/2014  . 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 Insomnia 10/03/2013  . prostate cancer 10/03/2013  . Hyperlipidemia 10/03/2013  . Hypothyroidism 10/03/2013  . Dysfunctions associated with sleep stages or arousal from sleep    Outpatient Encounter Prescriptions as of 09/05/2016  Medication Sig  . apixaban (ELIQUIS) 5 MG TABS tablet Take 5 mg by mouth 2 (two) times daily.   . Cholecalciferol (VITAMIN D-1000 MAX ST) 1000 units tablet Take by mouth.  . clonazePAM (KLONOPIN) 0.5 MG tablet 1/2 tab q HS po.  . Eszopiclone 3 MG TABS TAKE 1 TABLET BY MOUTH IMMEDIATELY BEFORE BEDTIME  . levothyroxine (SYNTHROID, LEVOTHROID) 50 MCG tablet TAKE 1  TABLET BY MOUTH DAILY  . metoprolol succinate (TOPROL-XL) 25 MG 24 hr tablet Take 25 mg by mouth daily.   . Multiple Vitamin (MULTI-VITAMINS) TABS Take by mouth.  . Omega-3 1000 MG CAPS Take 1 g by mouth.  . QUEtiapine (SEROQUEL) 50 MG tablet Take 1 tablet (50 mg total) by mouth at bedtime.  . rosuvastatin (CRESTOR) 10 MG tablet TAKE 1 TABLET BY MOUTH DAILY  . Suvorexant ([name]) 15 MG TABS Take 15 mg by mouth at bedtime as needed.   No facility-administered encounter medications on file as of 09/05/2016.       Review of Systems  Constitutional: Negative.   HENT: Negative.   Eyes: Negative.   Respiratory: Negative.   Cardiovascular: Negative.   Gastrointestinal: Negative.   Endocrine: Negative.   Genitourinary: Negative.   Musculoskeletal: Negative.   Skin: Negative.   Allergic/Immunologic: Negative.   Neurological: Negative.   Hematological: Negative.   Psychiatric/Behavioral: Negative.        Objective:   Physical Exam  Constitutional: He is oriented to person, place, and time. He appears well-developed and well-nourished. No distress.  The patient is pleasant and alert and has had no further symptoms of atrial fibrillation.  HENT:  Head: Normocephalic and atraumatic.  Right Ear: External ear normal.  Left Ear: External ear normal.  Nose: Nose normal.  Mouth/Throat: Oropharynx is clear and moist. No oropharyngeal exudate.  Eyes: Conjunctivae and EOM are normal. Pupils are equal, round, and reactive to light. Right eye exhibits no discharge. Left eye  exhibits no discharge. No scleral icterus.  Neck: Normal range of motion. Neck supple. No thyromegaly present.  No bruits or thyromegaly or anterior cervical adenopathy  Cardiovascular: Normal rate, regular rhythm, normal heart sounds and intact distal pulses.   No murmur heard. The heart today has a regular rate and rhythm at 72/m  Pulmonary/Chest: Effort normal and breath sounds normal. No respiratory distress. He has  no wheezes. He has no rales. He exhibits no tenderness.  Clear anteriorly and posteriorly and no axillary adenopathy  Abdominal: Soft. Bowel sounds are normal. He exhibits no mass. There is no tenderness. There is no rebound and no guarding.  No abdominal tenderness or organ enlargement bruits or masses. No inguinal adenopathy.  Musculoskeletal: Normal range of motion. He exhibits no edema.  Lymphadenopathy:    He has no cervical adenopathy.  Neurological: He is alert and oriented to person, place, and time. He has normal reflexes. No cranial nerve deficit.  Skin: Skin is warm and dry. No rash noted.  Psychiatric: He has a normal mood and affect. His behavior is normal. Judgment and thought content normal.  Mood and affect are positive. The patient is aware that he has to take his blood thinner regularly and he must follow through with the ablation that is planned  Nursing note and vitals reviewed.  BP 120/71 (BP Location: Left Arm)   Pulse 71   Temp 97.2 F (36.2 C) (Oral)   Ht 5\' 10"  (1.778 m)   Wt 232 lb (105.2 kg)   BMI 33.29 kg/m         Assessment & Plan:  1. Pure hypercholesterolemia -Continue to work aggressively with therapeutic lifestyle changes -Continue with Crestor  2. Atrial fibrillation, unspecified type ([place]) -Continue with blood thinner, Eliquis. -Follow-up with cardiology as planned for ablation  3. Vitamin D deficiency -Continue with vitamin D replacement except take 2 on Saturday and Sunday and 1 daily Monday through Friday  4. The patient does have prostate cancer and is followed regularly by his urologist. -The most recent PSA was 1 and this is good. -The patient will take a copy of his blood work to his urologist.  Patient Instructions                       Medicare Annual Wellness Visit  [facility] and the medical providers at [facility] strive to bring you the best medical care.  In doing so we not only want to address  your current medical conditions and concerns but also to detect new conditions early and prevent illness, disease and health-related problems.    Medicare offers a yearly Wellness Visit which allows our clinical staff to assess your need for preventative services including immunizations, lifestyle education, counseling to decrease risk of preventable diseases and screening for fall risk and other medical concerns.    This visit is provided free of charge (no copay) for all Medicare recipients. The clinical pharmacists at [facility] have begun to conduct these Wellness Visits which will also include a thorough review of all your medications.    As you primary medical provider recommend that you make an appointment for your Annual Wellness Visit if you have not done so already this year.  You may set up this appointment before you leave today or you may call back [phone] and schedule an appointment.  Please make sure when you call that you mention that you are scheduling your Annual Wellness  Visit with the clinical pharmacist so that the appointment may be made for the proper length of time.     Continue current medications. Continue good therapeutic lifestyle changes which include good diet and exercise. Fall precautions discussed with patient. If an FOBT was given today- please return it to our front desk. If you are over 44 years old - you may need Prevnar 46 or the adult Pneumonia vaccine.  **Flu shots are available--- please call and schedule a FLU-CLINIC appointment**  After your visit with [place] today you will receive a survey in the mail or online from [employer] regarding your care with [place]. Please take a moment to fill this out. Your feedback is very important to [place] as you can help [place] better understand your patient needs as well as improve your experience and satisfaction. WE CARE ABOUT YOU!!!   Follow-up with urology as planned and cardiology as planned Take a  copy of your lab work with you to those visits Make every effort possible to lose weight Consider trying a personal trainer   Arrie Senate MD

## 2016-09-06 ENCOUNTER — Telehealth: Payer: Self-pay

## 2016-09-06 NOTE — Telephone Encounter (Signed)
[place]/ [employer] called stating request has been approved starting 09/06/16 - 09/06/17.

## 2016-09-06 NOTE — Telephone Encounter (Signed)
I completed pa for eszopiclone and sent to [facility]. Should have a determination in 1-3 business days.

## 2016-09-06 NOTE — Telephone Encounter (Signed)
I spoke to [facility] and advised them that this pa was approved. Pharmacy ran the claim and it went through.

## 2016-09-07 ENCOUNTER — Other Ambulatory Visit: Payer: Medicare Other

## 2016-09-07 DIAGNOSIS — Z1211 Encounter for screening for malignant neoplasm of colon: Secondary | ICD-10-CM

## 2016-09-09 DIAGNOSIS — Z1211 Encounter for screening for malignant neoplasm of colon: Secondary | ICD-10-CM | POA: Diagnosis not present

## 2016-09-10 LAB — FECAL OCCULT BLOOD, IMMUNOCHEMICAL: Fecal Occult Bld: NEGATIVE

## 2016-10-19 ENCOUNTER — Other Ambulatory Visit: Payer: Self-pay | Admitting: Family Medicine

## 2016-10-21 DIAGNOSIS — I351 Nonrheumatic aortic (valve) insufficiency: Secondary | ICD-10-CM | POA: Diagnosis not present

## 2016-10-21 DIAGNOSIS — I483 Typical atrial flutter: Secondary | ICD-10-CM | POA: Diagnosis not present

## 2016-10-21 DIAGNOSIS — Q231 Congenital insufficiency of aortic valve: Secondary | ICD-10-CM | POA: Diagnosis not present

## 2016-10-27 DIAGNOSIS — E669 Obesity, unspecified: Secondary | ICD-10-CM | POA: Diagnosis not present

## 2016-10-27 DIAGNOSIS — E785 Hyperlipidemia, unspecified: Secondary | ICD-10-CM | POA: Diagnosis not present

## 2016-10-27 DIAGNOSIS — Z7901 Long term (current) use of anticoagulants: Secondary | ICD-10-CM | POA: Diagnosis not present

## 2016-10-27 DIAGNOSIS — Z6831 Body mass index (BMI) 31.0-31.9, adult: Secondary | ICD-10-CM | POA: Diagnosis not present

## 2016-10-27 DIAGNOSIS — Z888 Allergy status to other drugs, medicaments and biological substances status: Secondary | ICD-10-CM | POA: Diagnosis not present

## 2016-10-27 DIAGNOSIS — E039 Hypothyroidism, unspecified: Secondary | ICD-10-CM | POA: Diagnosis not present

## 2016-10-27 DIAGNOSIS — Z79899 Other long term (current) drug therapy: Secondary | ICD-10-CM | POA: Diagnosis not present

## 2016-10-27 DIAGNOSIS — I483 Typical atrial flutter: Secondary | ICD-10-CM | POA: Diagnosis not present

## 2016-10-27 DIAGNOSIS — K219 Gastro-esophageal reflux disease without esophagitis: Secondary | ICD-10-CM | POA: Diagnosis not present

## 2016-10-28 DIAGNOSIS — I4892 Unspecified atrial flutter: Secondary | ICD-10-CM | POA: Diagnosis not present

## 2016-10-28 DIAGNOSIS — I483 Typical atrial flutter: Secondary | ICD-10-CM | POA: Diagnosis not present

## 2016-11-01 ENCOUNTER — Telehealth: Payer: Self-pay | Admitting: Neurology

## 2016-11-01 ENCOUNTER — Other Ambulatory Visit: Payer: Self-pay

## 2016-11-01 DIAGNOSIS — G4752 REM sleep behavior disorder: Secondary | ICD-10-CM

## 2016-11-01 DIAGNOSIS — G4701 Insomnia due to medical condition: Secondary | ICD-10-CM

## 2016-11-01 DIAGNOSIS — G4750 Parasomnia, unspecified: Secondary | ICD-10-CM

## 2016-11-01 DIAGNOSIS — G4759 Other parasomnia: Secondary | ICD-10-CM

## 2016-11-01 MED ORDER — CLONAZEPAM 0.5 MG PO TABS: ORAL_TABLET | ORAL | 3 refills | Status: DC

## 2016-11-01 MED ORDER — SUVOREXANT 15 MG PO TABS: 15.0000 mg | ORAL_TABLET | Freq: Every evening | ORAL | 2 refills | Status: DC | PRN

## 2016-11-01 MED ORDER — ESZOPICLONE 3 MG PO TABS: ORAL_TABLET | ORAL | 0 refills | Status: DC

## 2016-11-01 NOTE — Addendum Note (Signed)
Addended by: Larey Seat on: 11/01/2016 05:11 PM   Modules accepted: Orders

## 2016-11-01 NOTE — Telephone Encounter (Signed)
This patient is taking belsomra and Lunesta alternating, and with his doctors approval.

## 2016-11-01 NOTE — Telephone Encounter (Signed)
[name] was asked to alternate [name]  and Klonopin, he is following physicians instructions and has not exceeded his medication dose. Marland Kitchen

## 2016-11-01 NOTE — Telephone Encounter (Signed)
I spoke to Designer, television/film set at [facility] [employer]. Pt is taking lunesta and belsomra alternating nights. Since both of these medications are controlled substances, [facility] needs approval from Dr. Brett Fairy to fill both of these, and both new RX's reflecting that pt is taking lunesta and belsomra alternating. Pt knows to not take both medications on the same night.  Will send to Dr. Brett Fairy for review.

## 2016-11-01 NOTE — Telephone Encounter (Signed)
RXs for lunesta, belsomra, and klonopin faxed to [facility]. Received a receipt of confirmation.

## 2016-11-01 NOTE — Telephone Encounter (Signed)
[facility] called in reference to patients Suvorexant ([name]) 15 MG TABS.  Please call

## 2016-11-01 NOTE — Addendum Note (Signed)
Addended by: Lester [place] A on: 11/01/2016 04:50 PM   Modules accepted: Orders

## 2016-11-14 DIAGNOSIS — I483 Typical atrial flutter: Secondary | ICD-10-CM | POA: Diagnosis not present

## 2016-11-14 DIAGNOSIS — Q231 Congenital insufficiency of aortic valve: Secondary | ICD-10-CM | POA: Diagnosis not present

## 2016-12-23 ENCOUNTER — Other Ambulatory Visit: Payer: Self-pay | Admitting: Family Medicine

## 2016-12-26 DIAGNOSIS — C61 Malignant neoplasm of prostate: Secondary | ICD-10-CM | POA: Diagnosis not present

## 2016-12-27 DIAGNOSIS — H5203 Hypermetropia, bilateral: Secondary | ICD-10-CM | POA: Diagnosis not present

## 2016-12-27 DIAGNOSIS — H524 Presbyopia: Secondary | ICD-10-CM | POA: Diagnosis not present

## 2016-12-27 DIAGNOSIS — H25093 Other age-related incipient cataract, bilateral: Secondary | ICD-10-CM | POA: Diagnosis not present

## 2017-01-02 DIAGNOSIS — N529 Male erectile dysfunction, unspecified: Secondary | ICD-10-CM | POA: Diagnosis not present

## 2017-01-02 DIAGNOSIS — C61 Malignant neoplasm of prostate: Secondary | ICD-10-CM | POA: Diagnosis not present

## 2017-03-02 ENCOUNTER — Other Ambulatory Visit: Payer: Self-pay | Admitting: Neurology

## 2017-03-02 DIAGNOSIS — G4750 Parasomnia, unspecified: Secondary | ICD-10-CM

## 2017-03-02 DIAGNOSIS — G4759 Other parasomnia: Secondary | ICD-10-CM

## 2017-03-02 DIAGNOSIS — G4701 Insomnia due to medical condition: Secondary | ICD-10-CM

## 2017-03-02 DIAGNOSIS — G4752 REM sleep behavior disorder: Secondary | ICD-10-CM

## 2017-03-02 NOTE — Telephone Encounter (Signed)
RX for belsomra faxed to [facility]. Received a receipt of confirmation.

## 2017-03-06 DIAGNOSIS — Q231 Congenital insufficiency of aortic valve: Secondary | ICD-10-CM | POA: Diagnosis not present

## 2017-03-06 DIAGNOSIS — I351 Nonrheumatic aortic (valve) insufficiency: Secondary | ICD-10-CM | POA: Diagnosis not present

## 2017-03-06 DIAGNOSIS — I483 Typical atrial flutter: Secondary | ICD-10-CM | POA: Diagnosis not present

## 2017-03-07 ENCOUNTER — Ambulatory Visit: Payer: Medicare Other | Admitting: Family Medicine

## 2017-03-09 ENCOUNTER — Other Ambulatory Visit: Payer: Self-pay | Admitting: Family Medicine

## 2017-03-09 ENCOUNTER — Encounter: Payer: Self-pay | Admitting: Family Medicine

## 2017-03-09 ENCOUNTER — Ambulatory Visit (INDEPENDENT_AMBULATORY_CARE_PROVIDER_SITE_OTHER): Payer: Medicare Other | Admitting: Family Medicine

## 2017-03-09 VITALS — BP 128/78 | HR 64 | Temp 98.6°F | Ht 70.0 in | Wt 228.0 lb

## 2017-03-09 DIAGNOSIS — E7800 Pure hypercholesterolemia, unspecified: Secondary | ICD-10-CM | POA: Diagnosis not present

## 2017-03-09 DIAGNOSIS — I4891 Unspecified atrial fibrillation: Secondary | ICD-10-CM | POA: Diagnosis not present

## 2017-03-09 DIAGNOSIS — E559 Vitamin D deficiency, unspecified: Secondary | ICD-10-CM | POA: Diagnosis not present

## 2017-03-09 DIAGNOSIS — F513 Sleepwalking [somnambulism]: Secondary | ICD-10-CM | POA: Diagnosis not present

## 2017-03-09 DIAGNOSIS — E034 Atrophy of thyroid (acquired): Secondary | ICD-10-CM | POA: Diagnosis not present

## 2017-03-09 DIAGNOSIS — F5101 Primary insomnia: Secondary | ICD-10-CM | POA: Diagnosis not present

## 2017-03-09 DIAGNOSIS — C61 Malignant neoplasm of prostate: Secondary | ICD-10-CM | POA: Diagnosis not present

## 2017-03-09 NOTE — Patient Instructions (Addendum)
Medicare Annual Wellness Visit  [facility] and the medical providers at [facility] strive to bring you the best medical care.  In doing so we not only want to address your current medical conditions and concerns but also to detect new conditions early and prevent illness, disease and health-related problems.    Medicare offers a yearly Wellness Visit which allows our clinical staff to assess your need for preventative services including immunizations, lifestyle education, counseling to decrease risk of preventable diseases and screening for fall risk and other medical concerns.    This visit is provided free of charge (no copay) for all Medicare recipients. The clinical pharmacists at [facility] have begun to conduct these Wellness Visits which will also include a thorough review of all your medications.    As you primary medical provider recommend that you make an appointment for your Annual Wellness Visit if you have not done so already this year.  You may set up this appointment before you leave today or you may call back [phone]) and schedule an appointment.  Please make sure when you call that you mention that you are scheduling your Annual Wellness Visit with the clinical pharmacist so that the appointment may be made for the proper length of time.     Continue current medications. Continue good therapeutic lifestyle changes which include good diet and exercise. Fall precautions discussed with patient. If an FOBT was given today- please return it to our front desk. If you are over 50 years old - you may need Prevnar 57 or the adult Pneumonia vaccine.  **Flu shots are available--- please call and schedule a FLU-CLINIC appointment**  After your visit with [place] today you will receive a survey in the mail or online from [employer] regarding your care with [place]. Please take a moment to fill this out. Your feedback is very  important to [place] as you can help [place] better understand your patient needs as well as improve your experience and satisfaction. WE CARE ABOUT YOU!!!   The patient should continue to follow-up regularly with the urologist because of his prostate cancer. He should also follow-up with the cardiologist on a regular basis because of atrial fib/flutter. He should continue to make all efforts at weight loss through diet and exercise He should avoid caffeine use as much as possible especially after lunch every day.

## 2017-03-09 NOTE — Progress Notes (Signed)
Subjective:    Patient ID: Benjamin Maxwell, male    DOB: 1946/03/28, 71 y.o.   MRN: [id number]  HPI  Pt here for follow up and management of chronic medical problems which includes hyperlipidemia and a fib. He is taking medication regularly.The patient is feeling well overall. He had a cardiac ablation and is no longer on a blood thinner. He sees a cardiologist every 6 months. This is Dr. Deno Etienne. He also sees Dr. Jonette Eva, his urologist about every 6 months because of his prostate cancer. The patient denies any chest pain or symptoms that he had associated with his atrial fib and flutter. He denies any shortness of breath. He denies any trouble with swallowing heartburn indigestion nausea vomiting diarrhea or blood in the stool. He's passing his water without problems. He understands the importance of weight loss and how weight gain could be contributing to back pain and knee pain. He will get lab work today and we will call him with those results as soon as they're returned. He will come back in the fall prior to his visit with the urologist and get his PSA done at that time.   Patient Active Problem List   Diagnosis Date Noted  . Atrial fibrillation ([place]) 09/05/2016  . Pure hypercholesterolemia 09/05/2016  . Prostate cancer ([place]) 09/05/2016  . 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 Obesity 11/24/2014  . Vitamin D deficiency 11/24/2014  . 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 Insomnia 10/03/2013  . prostate cancer 10/03/2013  . Hyperlipidemia 10/03/2013  . Hypothyroidism 10/03/2013  . Dysfunctions associated with sleep stages or arousal from sleep    Outpatient Encounter Prescriptions as of 03/09/2017  Medication Sig  . [name] 15 MG TABS TAKE 1 TABLET(15MG) BY MOUTH AT BEDTIME AS NEEDED  . Cholecalciferol (VITAMIN D-1000 MAX ST) 1000 units tablet Take by mouth.  . clonazePAM (KLONOPIN) 0.5 MG tablet 1/2 tab by mouth for parasomnia  control. This medication is taken nightly  . Eszopiclone 3 MG TABS TAKE 1 TABLET BY MOUTH IMMEDIATELY BEFORE BEDTIME. Alternate with belsomra.  Marland Kitchen levothyroxine (SYNTHROID, LEVOTHROID) 50 MCG tablet TAKE 1 TABLET BY MOUTH DAILY  . metoprolol succinate (TOPROL-XL) 25 MG 24 hr tablet Take 25 mg by mouth daily.   . Multiple Vitamin (MULTI-VITAMINS) TABS Take by mouth.  . Omega-3 1000 MG CAPS Take 1 g by mouth.  . QUEtiapine (SEROQUEL) 50 MG tablet Take 1 tablet (50 mg total) by mouth at bedtime.  . rosuvastatin (CRESTOR) 10 MG tablet TAKE 1 TABLET BY MOUTH DAILY  . [DISCONTINUED] apixaban (ELIQUIS) 5 MG TABS tablet Take 5 mg by mouth 2 (two) times daily.    No facility-administered encounter medications on file as of 03/09/2017.        Review of Systems  Constitutional: Negative.   HENT: Negative.   Eyes: Negative.   Respiratory: Negative.   Cardiovascular: Negative.   Gastrointestinal: Negative.   Endocrine: Negative.   Genitourinary: Negative.   Musculoskeletal: Positive for arthralgias (right knee pain).  Skin: Negative.   Allergic/Immunologic: Negative.   Neurological: Negative.   Hematological: Negative.   Psychiatric/Behavioral: Negative.        Objective:   Physical Exam  Constitutional: He is oriented to person, place, and time. He appears well-developed and well-nourished. No distress.  The patient is pleasant and alert  HENT:  Head: Normocephalic and atraumatic.  Right Ear: External ear normal.  Left Ear: External ear normal.  Nose: Nose normal.  Mouth/Throat: Oropharynx is clear and moist. No oropharyngeal exudate.  Eyes: Conjunctivae and EOM are normal. Pupils are equal, round, and reactive to light. Right eye exhibits no discharge. Left eye exhibits no discharge. No scleral icterus.  Neck: Normal range of motion. Neck supple. No thyromegaly present.  There is no thyromegaly bruits or anterior cervical adenopathy  Cardiovascular: Normal rate, regular rhythm,  normal heart sounds and intact distal pulses.   No murmur heard. Heart is regular at 72/m and the lungs are clear anteriorly and posteriorly  Pulmonary/Chest: Effort normal and breath sounds normal. No respiratory distress. He has no wheezes. He has no rales. He exhibits no tenderness.  No axillary adenopathy lungs are clear anteriorly and posteriorly  Abdominal: Soft. Bowel sounds are normal. He exhibits no mass. There is no tenderness. There is no rebound and no guarding.  Abdominal obesity without masses tenderness or organ enlargement or inguinal adenopathy  Genitourinary:  Genitourinary Comments: This exam is deferred to the urologist who sees the patient every 6 months  Musculoskeletal: Normal range of motion. He exhibits no edema.  Right knee pain and tenderness  Lymphadenopathy:    He has no cervical adenopathy.  Neurological: He is alert and oriented to person, place, and time. He has normal reflexes. No cranial nerve deficit.  Skin: Skin is warm and dry. No rash noted.  Psychiatric: He has a normal mood and affect. His behavior is normal. Judgment and thought content normal.  Nursing note and vitals reviewed.  BP 128/78 (BP Location: Left Arm)   Pulse 64   Temp 98.6 F (37 C) (Oral)   Ht 5' 10"  (1.778 m)   Wt 228 lb (103.4 kg)   BMI 32.71 kg/m         Assessment & Plan:  1. Pure hypercholesterolemia -Continue with aggressive therapeutic lifestyle changes and current treatment pending results of lab work - CBC with Differential/Platelet - BMP8+EGFR - Hepatic function panel - NMR, lipoprofile  2. Atrial fibrillation, unspecified type [facility]) -This is now supposedly resolved with having had an ablation. He still sees a cardiologist every 6 months. He is off his blood thinner now. - CBC with Differential/Platelet  3. Vitamin D deficiency -Continue current treatment pending results of lab work - CBC with Differential/Platelet - VITAMIN D 25 Hydroxy (Vit-D Deficiency,  Fractures)  4. Prostate cancer [facility]) -Continue to follow-up with urology every 6 months, Dr. Jonette Eva. - CBC with Differential/Platelet  5. Hypothyroidism due to acquired atrophy of thyroid -Continue current treatment pending results of lab work  6. Primary insomnia -The patient alternates with 2 different medications.  7. Sleep walking disorder -He is followed by the neurologist for this problem and is doing well with that currently.  No orders of the defined types were placed in this encounter.  Patient Instructions                       Medicare Annual Wellness Visit  [facility] and the medical providers at [facility] strive to bring you the best medical care.  In doing so we not only want to address your current medical conditions and concerns but also to detect new conditions early and prevent illness, disease and health-related problems.    Medicare offers a yearly Wellness Visit which allows our clinical staff to assess your need for preventative services including immunizations, lifestyle education, counseling to decrease risk of preventable diseases and screening for fall risk and other medical concerns.  This visit is provided free of charge (no copay) for all Medicare recipients. The clinical pharmacists at [facility] have begun to conduct these Wellness Visits which will also include a thorough review of all your medications.    As you primary medical provider recommend that you make an appointment for your Annual Wellness Visit if you have not done so already this year.  You may set up this appointment before you leave today or you may call back ([phone]) and schedule an appointment.  Please make sure when you call that you mention that you are scheduling your Annual Wellness Visit with the clinical pharmacist so that the appointment may be made for the proper length of time.     Continue current medications. Continue  good therapeutic lifestyle changes which include good diet and exercise. Fall precautions discussed with patient. If an FOBT was given today- please return it to our front desk. If you are over 45 years old - you may need Prevnar 71 or the adult Pneumonia vaccine.  **Flu shots are available--- please call and schedule a FLU-CLINIC appointment**  After your visit with [place] today you will receive a survey in the mail or online from [employer] regarding your care with [place]. Please take a moment to fill this out. Your feedback is very important to [place] as you can help [place] better understand your patient needs as well as improve your experience and satisfaction. WE CARE ABOUT YOU!!!   The patient should continue to follow-up regularly with the urologist because of his prostate cancer. He should also follow-up with the cardiologist on a regular basis because of atrial fib/flutter. He should continue to make all efforts at weight loss through diet and exercise He should avoid caffeine use as much as possible especially after lunch every day.  Arrie Senate MD

## 2017-03-10 LAB — HEPATIC FUNCTION PANEL
ALBUMIN: 4.2 g/dL (ref 3.6–4.8)
ALT: 18 IU/L (ref 0–44)
AST: 17 IU/L (ref 0–40)
Alkaline Phosphatase: 64 IU/L (ref 39–117)
BILIRUBIN TOTAL: 0.7 mg/dL (ref 0.0–1.2)
Bilirubin, Direct: 0.17 mg/dL (ref 0.00–0.40)
TOTAL PROTEIN: 7.2 g/dL (ref 6.0–8.5)

## 2017-03-10 LAB — CBC WITH DIFFERENTIAL/PLATELET
Basophils Absolute: 0.1 10*3/uL (ref 0.0–0.2)
Basos: 1 %
EOS (ABSOLUTE): 0.2 10*3/uL (ref 0.0–0.4)
EOS: 2 %
HEMATOCRIT: 43.6 % (ref 37.5–51.0)
Hemoglobin: 14.8 g/dL (ref 13.0–17.7)
IMMATURE GRANULOCYTES: 0 %
Immature Grans (Abs): 0 10*3/uL (ref 0.0–0.1)
Lymphocytes Absolute: 2.9 10*3/uL (ref 0.7–3.1)
Lymphs: 27 %
MCH: 30.2 pg (ref 26.6–33.0)
MCHC: 33.9 g/dL (ref 31.5–35.7)
MCV: 89 fL (ref 79–97)
MONOS ABS: 0.6 10*3/uL (ref 0.1–0.9)
Monocytes: 6 %
NEUTROS PCT: 64 %
Neutrophils Absolute: 6.7 10*3/uL (ref 1.4–7.0)
PLATELETS: 283 10*3/uL (ref 150–379)
RBC: 4.9 x10E6/uL (ref 4.14–5.80)
RDW: 14.1 % (ref 12.3–15.4)
WBC: 10.6 10*3/uL (ref 3.4–10.8)

## 2017-03-10 LAB — NMR, LIPOPROFILE
Cholesterol: 176 mg/dL (ref 100–199)
HDL Cholesterol by NMR: 54 mg/dL (ref >39)
HDL PARTICLE NUMBER: 38.3 umol/L (ref >=30.5)
LDL Particle Number: 1269 nmol/L — ABNORMAL HIGH (ref <1000)
LDL Size: 21.1 nm (ref >20.5)
LDL-C: 97 mg/dL (ref 0–99)
LP-IR Score: 42 (ref <=45)
SMALL LDL PARTICLE NUMBER: 536 nmol/L — AB (ref <=527)
TRIGLYCERIDES BY NMR: 123 mg/dL (ref 0–149)

## 2017-03-10 LAB — BMP8+EGFR
BUN/Creatinine Ratio: 13 (ref 10–24)
BUN: 13 mg/dL (ref 8–27)
CALCIUM: 9.7 mg/dL (ref 8.6–10.2)
CHLORIDE: 100 mmol/L (ref 96–106)
CO2: 23 mmol/L (ref 18–29)
Creatinine, Ser: 0.97 mg/dL (ref 0.76–1.27)
GFR calc Af Amer: 92 mL/min/{1.73_m2} (ref >59)
GFR, EST NON AFRICAN AMERICAN: 80 mL/min/{1.73_m2} (ref >59)
GLUCOSE: 88 mg/dL (ref 65–99)
POTASSIUM: 4.2 mmol/L (ref 3.5–5.2)
Sodium: 139 mmol/L (ref 134–144)

## 2017-03-10 LAB — VITAMIN D 25 HYDROXY (VIT D DEFICIENCY, FRACTURES): Vit D, 25-Hydroxy: 45.6 ng/mL (ref 30.0–100.0)

## 2017-04-22 ENCOUNTER — Other Ambulatory Visit: Payer: Self-pay | Admitting: Neurology

## 2017-04-22 ENCOUNTER — Other Ambulatory Visit: Payer: Self-pay | Admitting: Family Medicine

## 2017-04-22 DIAGNOSIS — G4701 Insomnia due to medical condition: Secondary | ICD-10-CM

## 2017-04-22 DIAGNOSIS — G4752 REM sleep behavior disorder: Secondary | ICD-10-CM

## 2017-04-22 DIAGNOSIS — G4759 Other parasomnia: Secondary | ICD-10-CM

## 2017-04-22 DIAGNOSIS — G4750 Parasomnia, unspecified: Secondary | ICD-10-CM

## 2017-04-27 ENCOUNTER — Other Ambulatory Visit: Payer: Self-pay | Admitting: Neurology

## 2017-04-27 DIAGNOSIS — G4750 Parasomnia, unspecified: Secondary | ICD-10-CM

## 2017-04-27 DIAGNOSIS — G4759 Other parasomnia: Secondary | ICD-10-CM

## 2017-04-27 DIAGNOSIS — G4701 Insomnia due to medical condition: Secondary | ICD-10-CM

## 2017-04-27 DIAGNOSIS — G4752 REM sleep behavior disorder: Secondary | ICD-10-CM

## 2017-05-08 ENCOUNTER — Other Ambulatory Visit: Payer: Self-pay | Admitting: Neurology

## 2017-05-08 DIAGNOSIS — G4701 Insomnia due to medical condition: Secondary | ICD-10-CM

## 2017-05-08 DIAGNOSIS — G4752 REM sleep behavior disorder: Secondary | ICD-10-CM

## 2017-05-08 DIAGNOSIS — G4750 Parasomnia, unspecified: Secondary | ICD-10-CM

## 2017-05-08 DIAGNOSIS — G4759 Other parasomnia: Secondary | ICD-10-CM

## 2017-05-09 ENCOUNTER — Other Ambulatory Visit: Payer: Self-pay | Admitting: Neurology

## 2017-05-09 DIAGNOSIS — G4750 Parasomnia, unspecified: Secondary | ICD-10-CM

## 2017-05-09 DIAGNOSIS — G4759 Other parasomnia: Secondary | ICD-10-CM

## 2017-05-09 DIAGNOSIS — G4701 Insomnia due to medical condition: Secondary | ICD-10-CM

## 2017-05-09 DIAGNOSIS — G4752 REM sleep behavior disorder: Secondary | ICD-10-CM

## 2017-05-09 MED ORDER — ESZOPICLONE 3 MG PO TABS: ORAL_TABLET | ORAL | 0 refills | Status: DC

## 2017-05-23 ENCOUNTER — Encounter: Payer: Self-pay | Admitting: Neurology

## 2017-05-23 ENCOUNTER — Encounter (INDEPENDENT_AMBULATORY_CARE_PROVIDER_SITE_OTHER): Payer: Self-pay

## 2017-05-23 ENCOUNTER — Ambulatory Visit (INDEPENDENT_AMBULATORY_CARE_PROVIDER_SITE_OTHER): Payer: Medicare Other | Admitting: Neurology

## 2017-05-23 DIAGNOSIS — G4750 Parasomnia, unspecified: Secondary | ICD-10-CM

## 2017-05-23 DIAGNOSIS — G4752 REM sleep behavior disorder: Secondary | ICD-10-CM | POA: Diagnosis not present

## 2017-05-23 DIAGNOSIS — G4759 Other parasomnia: Secondary | ICD-10-CM

## 2017-05-23 DIAGNOSIS — G4701 Insomnia due to medical condition: Secondary | ICD-10-CM

## 2017-05-23 MED ORDER — ESZOPICLONE 3 MG PO TABS: ORAL_TABLET | ORAL | 0 refills | Status: DC

## 2017-05-23 MED ORDER — SUVOREXANT 15 MG PO TABS: 15.0000 mg | ORAL_TABLET | Freq: Every evening | ORAL | 0 refills | Status: DC | PRN

## 2017-05-23 MED ORDER — QUETIAPINE FUMARATE 50 MG PO TABS: ORAL_TABLET | ORAL | 0 refills | Status: DC

## 2017-05-23 NOTE — Progress Notes (Signed)
[facility]  Provider:  Dr Shermaine Brigham Referring Provider: Chipper Herb, MD Primary Care Physician:  Chipper Herb, MD  Chief Complaint  Patient presents with  . Follow-up   Parasomnia.  Interval history from 05/23/2017, I have the pleasure of seeing Benjamin Maxwell, who has been a established patient with chronic insomnia. He has just returned from a trip to the [place]. He is in good health he continues to control sleep walking / parasomnias with Klonopin, and insomnia prn with Lunesta.    HPI:   Benjamin Maxwell is a 71 y.o. male here as a referral from Dr. Laurance Flatten for follow up on Parasomnia, treated with Klonopin. 05-19-2016. The patient has been followed here for almost 12  years. He is an active traveler and has just traveled again through [place], [place] and [place].  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 As past medical history I need to add  that he had altitude sickness while (2013)  in [place], when he reached the 5000 m range. His  insomnia in high altitude was probably related to central apnea . The patient's bedtime is around 10 PM he arises at about 6:30 in the morning, he break spontaneously and does not need an alarm. We can speak days a fairly similar in sleep times he may have one bathroom break at night. There is no history of falls at night of dizziness or light headedness. He failed Doxepin and Ambien, needs to go back on Lunesta.   Review of Systems: Out of a complete 14 system review, the patient complains of only the following symptoms, and all other reviewed systems are negative. parasomnia , controlled.  No jet lag problem.   Social History   Social History  . Marital status: Divorced    Spouse name: N/A  . Number of children: 2  . Years of education: college   Occupational History  . 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 Smoking status: Never Smoker  . Smokeless tobacco: Never Used   . Alcohol use Yes     Comment: Social  . Drug use: No  . Sexual activity: Not on file   Other Topics Concern  . Not on file   Social History Narrative   Patient is a Tree surgeon, works full time at [employer]. Patient has a college education. Patient is divorced.  Lives alone   Patient is right-handed.   Patient drinks very little caffeine (tea).    Family History  Problem Relation Age of Onset  . Atrial fibrillation Mother   . Pneumonia Father   . Cancer Father     Past Medical History:  Diagnosis Date  . Cancer [facility])    Prostate, followed by urology  . Diverticulosis   . 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 Hyperlipidemia   . Hypothyroid   . Pulmonary nodules   . Spondylolysis   . Syncope and collapse     Past Surgical History:  Procedure Laterality Date  . KNEE SURGERY Bilateral   . UMBILICAL HERNIA REPAIR      Current Outpatient Prescriptions  Medication Sig Dispense Refill  . [name] 15 MG TABS TAKE 1 TABLET(15MG ) BY MOUTH AT BEDTIME AS NEEDED 30 tablet 0  . Cholecalciferol (VITAMIN D-1000 MAX ST) 1000 units tablet Take by mouth.    . clonazePAM (KLONOPIN) 0.5 MG tablet 1/2 tab by mouth for parasomnia control. This medication  is taken nightly 45 tablet 3  . Eszopiclone 3 MG TABS TAKE 1 TABLET BY MOUTH IMMEDIATELY BEFORE BEDTIME. Alternate with belsomra. 30 tablet 0  . levothyroxine (SYNTHROID, LEVOTHROID) 50 MCG tablet TAKE 1 TABLET BY MOUTH DAILY 90 tablet 0  . metoprolol succinate (TOPROL-XL) 25 MG 24 hr tablet Take 25 mg by mouth daily.     . Multiple Vitamin (MULTI-VITAMINS) TABS Take by mouth.    . Omega-3 1000 MG CAPS Take 1 g by mouth.    . QUEtiapine (SEROQUEL) 50 MG tablet TAKE ONE TABLET(50MG  TOTAL) BY MOUTH AT BEDTIME 30 tablet 0  . rosuvastatin (CRESTOR) 10 MG tablet TAKE 1 TABLET BY MOUTH DAILY 30 tablet 5   No current facility-administered medications for this visit.     Allergies as of 05/23/2017 - Review  Complete 05/23/2017  Allergen Reaction Noted  . Levitra [vardenafil]  10/03/2013    Vitals: BP 132/76   Pulse 68   Ht 5\' 11"  (1.803 m)   Wt 232 lb (105.2 kg)   BMI 32.36 kg/m  Last Weight:  Wt Readings from Last 1 Encounters:  05/23/17 232 lb (105.2 kg)   Last Height:   Ht Readings from Last 1 Encounters:  05/23/17 5\' 11"  (1.803 m)   Vision Screening:  Left eye with correction.                                    Right eye with correction 20/20.  Physical exam:  General: The patient is awake, alert and appears not in acute distress. The patient is well groomed. Head: Normocephalic, atraumatic. Neck is supple. Mallampati 2, neck circumference: 16.1 , TMJ click on the right.  Cardiovascular:  Regular rate and rhythm, Skin:  Without evidence of edema, or rash Trunk: BMI is elevated, and patient  has normal posture.  Neurologic exam : The patient is awake and alert, oriented to place and time.  Memory subjective  described as intact. There is a normal attention span & concentration ability.  Speech is fluent without  dysarthria, dysphonia or aphasia. Mood and affect are appropriate.no hallucinations.   Cranial nerves:Pupils are equal and briskly reactive to light. Funduscopic exam without  evidence of pallor or edema.  Extraocular movements  in vertical and horizontal planes intact and without nystagmus.  Visual fields by finger perimetry are intact.Hearing to finger rub intact.  Facial sensation intact to fine touch- Facial motor strength is symmetric and tongue and uvula move midline.  Motor exam: Normal tone and normal muscle bulk and symmetric normal strength in all extremities. No cog-wheeling.  Sensory:  Fine touch, pinprick and vibration were tested in all extremities. Proprioception is  normal. Coordination: Rapid alternating movements in the fingers/hands is tested and normal.  Finger-to-nose normal without evidence of ataxia, dysmetria or tremor. Gait and station:  Patient walks without assistive device  Strength within normal limits.  Stance is stable and normal. Tandem gait is unfragmented. Romberg testing is normal.  Deep tendon reflexes: in the  upper and lower extremities are symmetric and intact.  Babinski maneuver response is  downgoing.  Assessment:  After physical and neurologic examination, review of laboratory studies, and pre-existing records, assessment :   1) Insomnia 20 minute visit for refill, Lunesta, as he has failed Zolpidem and Doxepin.   2)Tremor developed over the last 6 years , mild, and may be essential or Seroquel related, no parkinsonian changes to tone  and posture. No masked face.  3) Parasomnia.    Plan:  Treatment plan and additional workup will be reviewed under Problem List.  Refilled Seroquel/ Lunesta for the next year.  He liked [name]. Klonopin is prescribed by Dr Laurance Flatten.  He is prn using Lunesta.  Melatonin.

## 2017-05-23 NOTE — Addendum Note (Signed)
Addended by: Larey Seat on: 05/23/2017 10:59 AM   Modules accepted: Orders

## 2017-06-09 DIAGNOSIS — M1711 Unilateral primary osteoarthritis, right knee: Secondary | ICD-10-CM | POA: Diagnosis not present

## 2017-06-09 DIAGNOSIS — M25561 Pain in right knee: Secondary | ICD-10-CM | POA: Diagnosis not present

## 2017-06-16 DIAGNOSIS — M1990 Unspecified osteoarthritis, unspecified site: Secondary | ICD-10-CM | POA: Diagnosis not present

## 2017-06-16 DIAGNOSIS — M25561 Pain in right knee: Secondary | ICD-10-CM | POA: Diagnosis not present

## 2017-06-16 DIAGNOSIS — M1711 Unilateral primary osteoarthritis, right knee: Secondary | ICD-10-CM | POA: Diagnosis not present

## 2017-06-23 ENCOUNTER — Other Ambulatory Visit: Payer: Self-pay | Admitting: Neurology

## 2017-06-23 ENCOUNTER — Other Ambulatory Visit: Payer: Self-pay | Admitting: Family Medicine

## 2017-06-23 DIAGNOSIS — G4701 Insomnia due to medical condition: Secondary | ICD-10-CM

## 2017-06-23 DIAGNOSIS — G4752 REM sleep behavior disorder: Secondary | ICD-10-CM

## 2017-06-23 DIAGNOSIS — M1711 Unilateral primary osteoarthritis, right knee: Secondary | ICD-10-CM | POA: Diagnosis not present

## 2017-06-23 DIAGNOSIS — G4759 Other parasomnia: Secondary | ICD-10-CM

## 2017-06-23 DIAGNOSIS — M25561 Pain in right knee: Secondary | ICD-10-CM | POA: Diagnosis not present

## 2017-06-23 DIAGNOSIS — G4750 Parasomnia, unspecified: Secondary | ICD-10-CM

## 2017-06-23 NOTE — Telephone Encounter (Signed)
Please call in clonozepam with 1 refills

## 2017-06-23 NOTE — Telephone Encounter (Signed)
Received fax confirmation eszopiclone [phone].

## 2017-06-23 NOTE — Telephone Encounter (Signed)
Last seen 03/09/17  DWM  If approved route to nurse to call into [facility]  [phone]

## 2017-07-01 ENCOUNTER — Other Ambulatory Visit: Payer: Self-pay | Admitting: Neurology

## 2017-07-01 DIAGNOSIS — G4750 Parasomnia, unspecified: Secondary | ICD-10-CM

## 2017-07-01 DIAGNOSIS — G4752 REM sleep behavior disorder: Secondary | ICD-10-CM

## 2017-07-01 DIAGNOSIS — G4759 Other parasomnia: Secondary | ICD-10-CM

## 2017-07-01 DIAGNOSIS — G4701 Insomnia due to medical condition: Secondary | ICD-10-CM

## 2017-07-03 ENCOUNTER — Other Ambulatory Visit: Payer: Self-pay | Admitting: Family Medicine

## 2017-07-04 ENCOUNTER — Other Ambulatory Visit: Payer: Self-pay | Admitting: Family Medicine

## 2017-07-04 MED ORDER — METOPROLOL SUCCINATE ER 25 MG PO TB24: 25.0000 mg | ORAL_TABLET | Freq: Every day | ORAL | 0 refills | Status: DC

## 2017-07-04 NOTE — Telephone Encounter (Signed)
Go ahead and call in refill 

## 2017-07-04 NOTE — Telephone Encounter (Signed)
Rx called to pharmacy

## 2017-08-21 DIAGNOSIS — N529 Male erectile dysfunction, unspecified: Secondary | ICD-10-CM | POA: Diagnosis not present

## 2017-08-21 DIAGNOSIS — C61 Malignant neoplasm of prostate: Secondary | ICD-10-CM | POA: Diagnosis not present

## 2017-09-11 DIAGNOSIS — I483 Typical atrial flutter: Secondary | ICD-10-CM | POA: Diagnosis not present

## 2017-09-13 ENCOUNTER — Ambulatory Visit (INDEPENDENT_AMBULATORY_CARE_PROVIDER_SITE_OTHER): Payer: Medicare Other | Admitting: Family Medicine

## 2017-09-13 ENCOUNTER — Encounter: Payer: Self-pay | Admitting: Family Medicine

## 2017-09-13 ENCOUNTER — Ambulatory Visit (INDEPENDENT_AMBULATORY_CARE_PROVIDER_SITE_OTHER): Payer: Medicare Other

## 2017-09-13 VITALS — BP 119/77 | HR 65 | Temp 98.1°F | Ht 71.0 in | Wt 231.0 lb

## 2017-09-13 DIAGNOSIS — I4891 Unspecified atrial fibrillation: Secondary | ICD-10-CM | POA: Diagnosis not present

## 2017-09-13 DIAGNOSIS — M25561 Pain in right knee: Secondary | ICD-10-CM

## 2017-09-13 DIAGNOSIS — E034 Atrophy of thyroid (acquired): Secondary | ICD-10-CM

## 2017-09-13 DIAGNOSIS — C61 Malignant neoplasm of prostate: Secondary | ICD-10-CM

## 2017-09-13 DIAGNOSIS — Z23 Encounter for immunization: Secondary | ICD-10-CM

## 2017-09-13 DIAGNOSIS — E7800 Pure hypercholesterolemia, unspecified: Secondary | ICD-10-CM | POA: Diagnosis not present

## 2017-09-13 DIAGNOSIS — E559 Vitamin D deficiency, unspecified: Secondary | ICD-10-CM | POA: Diagnosis not present

## 2017-09-13 DIAGNOSIS — G8929 Other chronic pain: Secondary | ICD-10-CM | POA: Diagnosis not present

## 2017-09-13 DIAGNOSIS — M179 Osteoarthritis of knee, unspecified: Secondary | ICD-10-CM | POA: Diagnosis not present

## 2017-09-13 NOTE — Patient Instructions (Addendum)
Medicare Annual Wellness Visit  [facility] and the medical providers at [facility] strive to bring you the best medical care.  In doing so we not only want to address your current medical conditions and concerns but also to detect new conditions early and prevent illness, disease and health-related problems.    Medicare offers a yearly Wellness Visit which allows our clinical staff to assess your need for preventative services including immunizations, lifestyle education, counseling to decrease risk of preventable diseases and screening for fall risk and other medical concerns.    This visit is provided free of charge (no copay) for all Medicare recipients. The clinical pharmacists at [facility] have begun to conduct these Wellness Visits which will also include a thorough review of all your medications.    As you primary medical provider recommend that you make an appointment for your Annual Wellness Visit if you have not done so already this year.  You may set up this appointment before you leave today or you may call back ([phone]) and schedule an appointment.  Please make sure when you call that you mention that you are scheduling your Annual Wellness Visit with the clinical pharmacist so that the appointment may be made for the proper length of time.     Continue current medications. Continue good therapeutic lifestyle changes which include good diet and exercise. Fall precautions discussed with patient. If an FOBT was given today- please return it to our front desk. If you are over 79 years old - you may need Prevnar 17 or the adult Pneumonia vaccine.  **Flu shots are available--- please call and schedule a FLU-CLINIC appointment**  After your visit with [place] today you will receive a survey in the mail or online from [employer] regarding your care with [place]. Please take a moment to fill this out. Your feedback is very  important to [place] as you can help [place] better understand your patient needs as well as improve your experience and satisfaction. WE CARE ABOUT YOU!!!   Continue to follow-up with cardiologist as planned and urology as planned We will arrange for you to have an appointment with orthopedic surgeon when he comes to this office We will call with lab work results and x-ray results as soon as those results become available Continue to make every effort possible to lose weight through diet and exercise and especially reducing sugar intake in your body. This winter drink plenty of fluids stay well-hydrated and keep the house as cool as possible

## 2017-09-13 NOTE — Progress Notes (Signed)
Subjective:    Patient ID: Benjamin Maxwell, male    DOB: November 30, 1945, 71 y.o.   MRN: [id number]  HPI Pt here for follow up and management of chronic medical problems which includes hyperlipidemia. He is taking medication regularly.  The patient returns for his regular visit today.  He recently saw his urologist Dr. Jonette Eva in [facility] and had a PSA that was 0.87.  He sees the urologist regularly.  He also has been seen by the cardiologist in [facility] because of atrial fibrillation.  He is due to return and FOBT get lab work and get his flu shot today.  He also complains today of some right knee pain.  The patient also recently saw the cardiologist that did his ablation.  The cardiologist was pleased with the visit.  He plans to see his regular cardiologist the end of the month and will have what sounds like a myocardial perfusion scan.  He sees each of the cardiologist on a yearly basis.  He also recently saw Dr. Jonette Eva and he will see him again in April because of the prostate cancer.  The patient had his last colonoscopy and says it was normal and will not be due another colonoscopy for 10 years from that date.  There is no family history of colon cancer.  The patient has been having trouble with her right knee for years and it has gotten worse recently.  He has seen the orthopedic surgeon in the past.  We will arrange for him to see the orthopedic surgeon again when he comes to this office at the next visit.  We will get x-rays of the knee today.     Patient Active Problem List   Diagnosis Date Noted  . 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 Atrial fibrillation ([place]) 09/05/2016  . Pure hypercholesterolemia 09/05/2016  . Prostate cancer ([place]) 09/05/2016  . 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 Obesity 11/24/2014  . Vitamin D deficiency 11/24/2014  . 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 Insomnia 10/03/2013  . prostate cancer  10/03/2013  . Hyperlipidemia 10/03/2013  . Hypothyroidism 10/03/2013  . Dysfunctions associated with sleep stages or arousal from sleep    Outpatient Encounter Medications as of 09/13/2017  Medication Sig  . Cholecalciferol (VITAMIN D-1000 MAX ST) 1000 units tablet Take by mouth.  . clonazePAM (KLONOPIN) 0.5 MG tablet TAKE 1/2 TABLET BY MOUTH FOR PARASOMNIA CONTROL. THIS MEDICATION IS TAKEN NIGHTLY.  . Eszopiclone 3 MG TABS TAKE ONE TABLET BY MOUTH IMMEDIATELY BEFORE BEDTIME  . levothyroxine (SYNTHROID, LEVOTHROID) 50 MCG tablet TAKE 1 TABLET BY MOUTH DAILY  . metoprolol succinate (TOPROL-XL) 25 MG 24 hr tablet Take 1 tablet (25 mg total) by mouth daily.  . Multiple Vitamin (MULTI-VITAMINS) TABS Take by mouth.  . Omega-3 1000 MG CAPS Take 1 g by mouth.  . QUEtiapine (SEROQUEL) 50 MG tablet TAKE 1 TABLET BY MOUTH AT BEDTIME. MUST KEEP AUGUST APPT  . rosuvastatin (CRESTOR) 10 MG tablet TAKE 1 TABLET BY MOUTH DAILY  . Suvorexant ([name]) 15 MG TABS Take 15 mg by mouth at bedtime as needed.   No facility-administered encounter medications on file as of 09/13/2017.       Review of Systems  Constitutional: Negative.   HENT: Negative.   Eyes: Negative.   Respiratory: Negative.   Cardiovascular: Negative.   Gastrointestinal: Negative.   Endocrine: Negative.   Genitourinary: Negative.   Musculoskeletal: Positive for arthralgias (right knee pain ).  Skin: Negative.   Allergic/Immunologic: Negative.   Neurological: Negative.   Hematological: Negative.   Psychiatric/Behavioral: Negative.        Objective:   Physical Exam  Constitutional: He is oriented to person, place, and time. He appears well-developed and well-nourished. No distress.  Patient is pleasant and alert.  HENT:  Head: Normocephalic and atraumatic.  Right Ear: External ear normal.  Left Ear: External ear normal.  Nose: Nose normal.  Mouth/Throat: Oropharynx is clear and moist. No oropharyngeal exudate.  Eyes:  Conjunctivae and EOM are normal. Pupils are equal, round, and reactive to light. Right eye exhibits no discharge. Left eye exhibits no discharge. No scleral icterus.  Up-to-date on eye exams  Neck: Normal range of motion. Neck supple. No thyromegaly present.  No bruits thyromegaly or anterior cervical adenopathy  Cardiovascular: Normal rate, regular rhythm, normal heart sounds and intact distal pulses.  No murmur heard. Heart is regular at 60/min with good pedal pulses  Pulmonary/Chest: Effort normal and breath sounds normal. No respiratory distress. He has no wheezes. He has no rales. He exhibits no tenderness.  No axillary adenopathy with good breath sounds anteriorly and posteriorly  Abdominal: Soft. Bowel sounds are normal. He exhibits no mass. There is no tenderness. There is no rebound and no guarding.  No liver or spleen enlargement no epigastric tenderness no inguinal adenopathy no masses and no bruits  Genitourinary:  Genitourinary Comments: This is followed regularly by Dr. Jonette Eva his urologist  Musculoskeletal: Normal range of motion. He exhibits no edema or tenderness.  Tender bilateral joint lines of right knee with palpation.  No warmth or redness.  Lymphadenopathy:    He has no cervical adenopathy.  Neurological: He is alert and oriented to person, place, and time. He has normal reflexes. No cranial nerve deficit.  Skin: Skin is warm and dry. No rash noted.  Psychiatric: He has a normal mood and affect. His behavior is normal. Judgment and thought content normal.  Nursing note and vitals reviewed.  BP 119/77 (BP Location: Left Arm)   Pulse 65   Temp 98.1 F (36.7 C) (Oral)   Ht 5' 11"  (1.803 m)   Wt 231 lb (104.8 kg)   BMI 32.22 kg/m         Assessment & Plan:  1. Pure hypercholesterolemia -Continue with aggressive therapeutic lifestyle changes and current treatment pending results of lab work - CBC with Differential/Platelet - BMP8+EGFR - Hepatic function  panel - NMR, lipoprofile  2. Atrial fibrillation, unspecified type ([place]) -Continue to follow-up with cardiology - CBC with Differential/Platelet - BMP8+EGFR  3. Prostate cancer ([facility]) -Continue to follow-up with urology and it is important to note that last PSA was very good at 0.87. - CBC with Differential/Platelet  4. Vitamin D deficiency -Continue current treatment pending results of lab work - CBC with Differential/Platelet - VITAMIN D 25 Hydroxy (Vit-D Deficiency, Fractures)  5. Hypothyroidism due to acquired atrophy of thyroid -Continue current treatment pending results of lab work - CBC with Differential/Platelet - Thyroid Panel With TSH  6. Chronic pain of right knee - CBC with Differential/Platelet - DG Knee 1-2 Views Right; Future - Ambulatory referral to Orthopedic Surgery  Patient Instructions                       Medicare Annual Wellness Visit  [facility] and the medical providers at [facility] strive to bring you the best medical care.  In doing so we  not only want to address your current medical conditions and concerns but also to detect new conditions early and prevent illness, disease and health-related problems.    Medicare offers a yearly Wellness Visit which allows our clinical staff to assess your need for preventative services including immunizations, lifestyle education, counseling to decrease risk of preventable diseases and screening for fall risk and other medical concerns.    This visit is provided free of charge (no copay) for all Medicare recipients. The clinical pharmacists at [facility] have begun to conduct these Wellness Visits which will also include a thorough review of all your medications.    As you primary medical provider recommend that you make an appointment for your Annual Wellness Visit if you have not done so already this year.  You may set up this appointment before you leave today or you  may call back ([phone]) and schedule an appointment.  Please make sure when you call that you mention that you are scheduling your Annual Wellness Visit with the clinical pharmacist so that the appointment may be made for the proper length of time.     Continue current medications. Continue good therapeutic lifestyle changes which include good diet and exercise. Fall precautions discussed with patient. If an FOBT was given today- please return it to our front desk. If you are over 71 years old - you may need Prevnar 58 or the adult Pneumonia vaccine.  **Flu shots are available--- please call and schedule a FLU-CLINIC appointment**  After your visit with [place] today you will receive a survey in the mail or online from [employer] regarding your care with [place]. Please take a moment to fill this out. Your feedback is very important to [place] as you can help [place] better understand your patient needs as well as improve your experience and satisfaction. WE CARE ABOUT YOU!!!   Continue to follow-up with cardiologist as planned and urology as planned We will arrange for you to have an appointment with orthopedic surgeon when he comes to this office We will call with lab work results and x-ray results as soon as those results become available Continue to make every effort possible to lose weight through diet and exercise and especially reducing sugar intake in your body. This winter drink plenty of fluids stay well-hydrated and keep the house as cool as possible    Arrie Senate MD

## 2017-09-14 ENCOUNTER — Other Ambulatory Visit: Payer: Medicare Other

## 2017-09-14 DIAGNOSIS — Z1211 Encounter for screening for malignant neoplasm of colon: Secondary | ICD-10-CM

## 2017-09-14 LAB — BMP8+EGFR
BUN / CREAT RATIO: 7 — AB (ref 10–24)
BUN: 8 mg/dL (ref 8–27)
CHLORIDE: 101 mmol/L (ref 96–106)
CO2: 26 mmol/L (ref 20–29)
Calcium: 9.5 mg/dL (ref 8.6–10.2)
Creatinine, Ser: 1.19 mg/dL (ref 0.76–1.27)
GFR calc Af Amer: 72 mL/min/{1.73_m2} (ref >59)
GFR calc non Af Amer: 62 mL/min/{1.73_m2} (ref >59)
GLUCOSE: 99 mg/dL (ref 65–99)
POTASSIUM: 4.3 mmol/L (ref 3.5–5.2)
SODIUM: 138 mmol/L (ref 134–144)

## 2017-09-14 LAB — CBC WITH DIFFERENTIAL/PLATELET
BASOS ABS: 0 10*3/uL (ref 0.0–0.2)
BASOS: 0 %
EOS (ABSOLUTE): 0.2 10*3/uL (ref 0.0–0.4)
Eos: 2 %
HEMOGLOBIN: 14 g/dL (ref 13.0–17.7)
Hematocrit: 41 % (ref 37.5–51.0)
Immature Grans (Abs): 0 10*3/uL (ref 0.0–0.1)
Immature Granulocytes: 1 %
LYMPHS ABS: 2.5 10*3/uL (ref 0.7–3.1)
Lymphs: 34 %
MCH: 30.4 pg (ref 26.6–33.0)
MCHC: 34.1 g/dL (ref 31.5–35.7)
MCV: 89 fL (ref 79–97)
MONOCYTES: 8 %
Monocytes Absolute: 0.6 10*3/uL (ref 0.1–0.9)
NEUTROS ABS: 4 10*3/uL (ref 1.4–7.0)
Neutrophils: 55 %
Platelets: 259 10*3/uL (ref 150–379)
RBC: 4.61 x10E6/uL (ref 4.14–5.80)
RDW: 13.9 % (ref 12.3–15.4)
WBC: 7.4 10*3/uL (ref 3.4–10.8)

## 2017-09-14 LAB — THYROID PANEL WITH TSH
FREE THYROXINE INDEX: 1.7 (ref 1.2–4.9)
T3 UPTAKE RATIO: 26 % (ref 24–39)
T4, Total: 6.6 ug/dL (ref 4.5–12.0)
TSH: 3.22 u[IU]/mL (ref 0.450–4.500)

## 2017-09-14 LAB — NMR, LIPOPROFILE
CHOLESTEROL: 130 mg/dL (ref 100–199)
HDL CHOLESTEROL BY NMR: 52 mg/dL (ref >39)
HDL Particle Number: 39.9 umol/L (ref >=30.5)
LDL Particle Number: 660 nmol/L (ref <1000)
LDL Size: 20.5 nm (ref >20.5)
LDL-C: 58 mg/dL (ref 0–99)
LP-IR Score: 50 — ABNORMAL HIGH (ref <=45)
Small LDL Particle Number: 342 nmol/L (ref <=527)
TRIGLYCERIDES BY NMR: 100 mg/dL (ref 0–149)

## 2017-09-14 LAB — HEPATIC FUNCTION PANEL
ALT: 22 IU/L (ref 0–44)
AST: 21 IU/L (ref 0–40)
Albumin: 4.3 g/dL (ref 3.6–4.8)
Alkaline Phosphatase: 64 IU/L (ref 39–117)
BILIRUBIN, DIRECT: 0.25 mg/dL (ref 0.00–0.40)
Bilirubin Total: 0.9 mg/dL (ref 0.0–1.2)
Total Protein: 7.4 g/dL (ref 6.0–8.5)

## 2017-09-14 LAB — VITAMIN D 25 HYDROXY (VIT D DEFICIENCY, FRACTURES): VIT D 25 HYDROXY: 41 ng/mL (ref 30.0–100.0)

## 2017-09-16 LAB — FECAL OCCULT BLOOD, IMMUNOCHEMICAL: FECAL OCCULT BLD: NEGATIVE

## 2017-09-24 ENCOUNTER — Other Ambulatory Visit: Payer: Self-pay | Admitting: Family Medicine

## 2017-10-02 ENCOUNTER — Other Ambulatory Visit: Payer: Self-pay | Admitting: Family Medicine

## 2017-10-05 ENCOUNTER — Telehealth: Payer: Self-pay | Admitting: Neurology

## 2017-10-05 DIAGNOSIS — I088 Other rheumatic multiple valve diseases: Secondary | ICD-10-CM | POA: Diagnosis not present

## 2017-10-05 DIAGNOSIS — I517 Cardiomegaly: Secondary | ICD-10-CM | POA: Diagnosis not present

## 2017-10-05 NOTE — Telephone Encounter (Signed)
[place]/[facility] [phone] request PA for Suvorexant ([name]) 15 MG TABS .

## 2017-10-06 NOTE — Telephone Encounter (Signed)
PA submitted for [name]. KEY NVBUPT. Can take up to 3 days for a response

## 2017-10-12 ENCOUNTER — Other Ambulatory Visit: Payer: Self-pay | Admitting: Neurology

## 2017-10-12 DIAGNOSIS — Q231 Congenital insufficiency of aortic valve: Secondary | ICD-10-CM | POA: Diagnosis not present

## 2017-10-12 DIAGNOSIS — I483 Typical atrial flutter: Secondary | ICD-10-CM | POA: Diagnosis not present

## 2017-10-13 NOTE — Telephone Encounter (Signed)
PA approved for the patient from 10/06/17-10/06/2018. KEY NVBUPT on cover my meds.

## 2017-10-22 ENCOUNTER — Other Ambulatory Visit: Payer: Self-pay | Admitting: Family Medicine

## 2017-10-23 NOTE — Telephone Encounter (Signed)
Last seen 09/13/17 DWM

## 2017-11-10 DIAGNOSIS — M1712 Unilateral primary osteoarthritis, left knee: Secondary | ICD-10-CM | POA: Diagnosis not present

## 2017-11-10 DIAGNOSIS — M25561 Pain in right knee: Secondary | ICD-10-CM | POA: Diagnosis not present

## 2017-11-10 DIAGNOSIS — M1711 Unilateral primary osteoarthritis, right knee: Secondary | ICD-10-CM | POA: Diagnosis not present

## 2017-11-13 DIAGNOSIS — M25561 Pain in right knee: Secondary | ICD-10-CM | POA: Diagnosis not present

## 2017-12-14 DIAGNOSIS — M25562 Pain in left knee: Secondary | ICD-10-CM | POA: Diagnosis not present

## 2017-12-14 DIAGNOSIS — M25561 Pain in right knee: Secondary | ICD-10-CM | POA: Diagnosis not present

## 2018-01-09 DIAGNOSIS — H25093 Other age-related incipient cataract, bilateral: Secondary | ICD-10-CM | POA: Diagnosis not present

## 2018-01-09 DIAGNOSIS — H5203 Hypermetropia, bilateral: Secondary | ICD-10-CM | POA: Diagnosis not present

## 2018-01-09 DIAGNOSIS — H52201 Unspecified astigmatism, right eye: Secondary | ICD-10-CM | POA: Diagnosis not present

## 2018-01-15 ENCOUNTER — Telehealth: Payer: Self-pay | Admitting: *Deleted

## 2018-01-15 ENCOUNTER — Other Ambulatory Visit: Payer: Self-pay | Admitting: Neurology

## 2018-01-15 DIAGNOSIS — G4750 Parasomnia, unspecified: Secondary | ICD-10-CM

## 2018-01-15 DIAGNOSIS — F439 Reaction to severe stress, unspecified: Secondary | ICD-10-CM

## 2018-01-15 DIAGNOSIS — G4752 REM sleep behavior disorder: Secondary | ICD-10-CM

## 2018-01-15 DIAGNOSIS — Z8489 Family history of other specified conditions: Secondary | ICD-10-CM

## 2018-01-15 DIAGNOSIS — G4701 Insomnia due to medical condition: Secondary | ICD-10-CM

## 2018-01-15 DIAGNOSIS — G4759 Other parasomnia: Secondary | ICD-10-CM

## 2018-01-15 NOTE — Telephone Encounter (Signed)
Referral placed.

## 2018-01-15 NOTE — Telephone Encounter (Signed)
Please call the Pal line at [facility] and see if they have a psychologist that can talk with him regarding grief counseling as soon as possible

## 2018-01-16 ENCOUNTER — Telehealth: Payer: Self-pay | Admitting: Neurology

## 2018-01-16 NOTE — Telephone Encounter (Signed)
PA submitted through cover my meds/BCBS for the patient for Eszopiclone. Can take up to 72 hours before hearing back. KEY: [id number]

## 2018-01-17 NOTE — Telephone Encounter (Signed)
PA approved for the patient's Eszopiclone  From 01/16/18-01/17/19 through [place] [id number]

## 2018-01-23 DIAGNOSIS — M1711 Unilateral primary osteoarthritis, right knee: Secondary | ICD-10-CM | POA: Diagnosis not present

## 2018-01-23 DIAGNOSIS — E669 Obesity, unspecified: Secondary | ICD-10-CM | POA: Diagnosis not present

## 2018-01-23 DIAGNOSIS — M25462 Effusion, left knee: Secondary | ICD-10-CM | POA: Diagnosis not present

## 2018-01-23 DIAGNOSIS — M1712 Unilateral primary osteoarthritis, left knee: Secondary | ICD-10-CM | POA: Diagnosis not present

## 2018-01-23 DIAGNOSIS — M7121 Synovial cyst of popliteal space [Baker], right knee: Secondary | ICD-10-CM | POA: Diagnosis not present

## 2018-01-23 DIAGNOSIS — I4892 Unspecified atrial flutter: Secondary | ICD-10-CM | POA: Diagnosis not present

## 2018-01-23 DIAGNOSIS — M170 Bilateral primary osteoarthritis of knee: Secondary | ICD-10-CM | POA: Diagnosis not present

## 2018-01-23 DIAGNOSIS — M25561 Pain in right knee: Secondary | ICD-10-CM | POA: Diagnosis not present

## 2018-01-23 DIAGNOSIS — E039 Hypothyroidism, unspecified: Secondary | ICD-10-CM | POA: Diagnosis not present

## 2018-01-23 DIAGNOSIS — Z8546 Personal history of malignant neoplasm of prostate: Secondary | ICD-10-CM | POA: Diagnosis not present

## 2018-01-23 DIAGNOSIS — Z6830 Body mass index (BMI) 30.0-30.9, adult: Secondary | ICD-10-CM | POA: Diagnosis not present

## 2018-01-23 DIAGNOSIS — M19071 Primary osteoarthritis, right ankle and foot: Secondary | ICD-10-CM | POA: Diagnosis not present

## 2018-01-23 DIAGNOSIS — Z01812 Encounter for preprocedural laboratory examination: Secondary | ICD-10-CM | POA: Diagnosis not present

## 2018-01-23 DIAGNOSIS — M19072 Primary osteoarthritis, left ankle and foot: Secondary | ICD-10-CM | POA: Diagnosis not present

## 2018-01-23 DIAGNOSIS — Z01818 Encounter for other preprocedural examination: Secondary | ICD-10-CM | POA: Diagnosis not present

## 2018-01-23 DIAGNOSIS — M1612 Unilateral primary osteoarthritis, left hip: Secondary | ICD-10-CM | POA: Diagnosis not present

## 2018-02-05 DIAGNOSIS — E669 Obesity, unspecified: Secondary | ICD-10-CM | POA: Diagnosis not present

## 2018-02-05 DIAGNOSIS — R55 Syncope and collapse: Secondary | ICD-10-CM | POA: Diagnosis not present

## 2018-02-05 DIAGNOSIS — Z79899 Other long term (current) drug therapy: Secondary | ICD-10-CM | POA: Diagnosis not present

## 2018-02-05 DIAGNOSIS — E785 Hyperlipidemia, unspecified: Secondary | ICD-10-CM | POA: Diagnosis not present

## 2018-02-05 DIAGNOSIS — Z471 Aftercare following joint replacement surgery: Secondary | ICD-10-CM | POA: Diagnosis not present

## 2018-02-05 DIAGNOSIS — K219 Gastro-esophageal reflux disease without esophagitis: Secondary | ICD-10-CM | POA: Diagnosis not present

## 2018-02-05 DIAGNOSIS — Z888 Allergy status to other drugs, medicaments and biological substances status: Secondary | ICD-10-CM | POA: Diagnosis not present

## 2018-02-05 DIAGNOSIS — I4892 Unspecified atrial flutter: Secondary | ICD-10-CM | POA: Diagnosis not present

## 2018-02-05 DIAGNOSIS — M170 Bilateral primary osteoarthritis of knee: Secondary | ICD-10-CM | POA: Diagnosis not present

## 2018-02-05 DIAGNOSIS — G8918 Other acute postprocedural pain: Secondary | ICD-10-CM | POA: Diagnosis not present

## 2018-02-05 DIAGNOSIS — Z96653 Presence of artificial knee joint, bilateral: Secondary | ICD-10-CM | POA: Diagnosis not present

## 2018-02-05 DIAGNOSIS — Z4789 Encounter for other orthopedic aftercare: Secondary | ICD-10-CM | POA: Diagnosis not present

## 2018-02-05 DIAGNOSIS — M1990 Unspecified osteoarthritis, unspecified site: Secondary | ICD-10-CM | POA: Diagnosis not present

## 2018-02-05 DIAGNOSIS — Z6829 Body mass index (BMI) 29.0-29.9, adult: Secondary | ICD-10-CM | POA: Diagnosis not present

## 2018-02-05 DIAGNOSIS — E039 Hypothyroidism, unspecified: Secondary | ICD-10-CM | POA: Diagnosis not present

## 2018-02-05 DIAGNOSIS — R339 Retention of urine, unspecified: Secondary | ICD-10-CM | POA: Diagnosis not present

## 2018-02-05 DIAGNOSIS — Z8546 Personal history of malignant neoplasm of prostate: Secondary | ICD-10-CM | POA: Diagnosis not present

## 2018-02-09 DIAGNOSIS — E785 Hyperlipidemia, unspecified: Secondary | ICD-10-CM | POA: Diagnosis not present

## 2018-02-09 DIAGNOSIS — R55 Syncope and collapse: Secondary | ICD-10-CM | POA: Diagnosis not present

## 2018-02-09 DIAGNOSIS — M170 Bilateral primary osteoarthritis of knee: Secondary | ICD-10-CM | POA: Diagnosis not present

## 2018-02-09 DIAGNOSIS — Z4789 Encounter for other orthopedic aftercare: Secondary | ICD-10-CM | POA: Diagnosis not present

## 2018-02-09 DIAGNOSIS — E039 Hypothyroidism, unspecified: Secondary | ICD-10-CM | POA: Diagnosis not present

## 2018-02-09 DIAGNOSIS — M1990 Unspecified osteoarthritis, unspecified site: Secondary | ICD-10-CM | POA: Diagnosis not present

## 2018-02-09 DIAGNOSIS — Z96653 Presence of artificial knee joint, bilateral: Secondary | ICD-10-CM | POA: Diagnosis not present

## 2018-02-11 MED ORDER — ONDANSETRON HCL 4 MG/2ML IJ SOLN
4.00 | INTRAMUSCULAR | Status: DC
Start: ? — End: 2018-02-11

## 2018-02-11 MED ORDER — GENERIC EXTERNAL MEDICATION
0.08 | Status: DC
Start: ? — End: 2018-02-11

## 2018-02-11 MED ORDER — VITAMIN D3 25 MCG (1000 UNIT) PO TABS
5000.00 | ORAL_TABLET | ORAL | Status: DC
Start: 2018-02-10 — End: 2018-02-11

## 2018-02-11 MED ORDER — HYDROXYZINE HCL 25 MG PO TABS
25.00 | ORAL_TABLET | ORAL | Status: DC
Start: ? — End: 2018-02-11

## 2018-02-11 MED ORDER — ONDANSETRON HCL 4 MG PO TABS
4.00 | ORAL_TABLET | ORAL | Status: DC
Start: ? — End: 2018-02-11

## 2018-02-11 MED ORDER — DOXEPIN HCL 10 MG/ML PO CONC
3.00 | ORAL | Status: DC
Start: ? — End: 2018-02-11

## 2018-02-11 MED ORDER — ACETAMINOPHEN 500 MG PO TABS
1000.00 | ORAL_TABLET | ORAL | Status: DC
Start: 2018-02-09 — End: 2018-02-11

## 2018-02-11 MED ORDER — OXYCODONE HCL 5 MG PO TABS
5.00 | ORAL_TABLET | ORAL | Status: DC
Start: ? — End: 2018-02-11

## 2018-02-11 MED ORDER — ENOXAPARIN SODIUM 40 MG/0.4ML ~~LOC~~ SOLN
40.00 | SUBCUTANEOUS | Status: DC
Start: 2018-02-10 — End: 2018-02-11

## 2018-02-11 MED ORDER — TAMSULOSIN HCL 0.4 MG PO CAPS
0.40 | ORAL_CAPSULE | ORAL | Status: DC
Start: 2018-02-10 — End: 2018-02-11

## 2018-02-11 MED ORDER — SENNA-DOCUSATE SODIUM 8.6-50 MG PO TABS
1.00 | ORAL_TABLET | ORAL | Status: DC
Start: 2018-02-09 — End: 2018-02-11

## 2018-02-11 MED ORDER — HYDROMORPHONE HCL 2 MG/ML IJ SOLN
0.50 | INTRAMUSCULAR | Status: DC
Start: ? — End: 2018-02-11

## 2018-02-11 MED ORDER — GENERIC EXTERNAL MEDICATION
.04 | Status: DC
Start: ? — End: 2018-02-11

## 2018-02-11 MED ORDER — CELECOXIB 200 MG PO CAPS
200.00 | ORAL_CAPSULE | ORAL | Status: DC
Start: 2018-02-09 — End: 2018-02-11

## 2018-02-11 MED ORDER — KETAMINE HCL 50 MG/ML IJ SOLN
10.00 | INTRAMUSCULAR | Status: DC
Start: ? — End: 2018-02-11

## 2018-02-11 MED ORDER — ATORVASTATIN CALCIUM 40 MG PO TABS
40.00 | ORAL_TABLET | ORAL | Status: DC
Start: 2018-02-09 — End: 2018-02-11

## 2018-02-11 MED ORDER — POLYETHYLENE GLYCOL 3350 17 G PO PACK
17.00 | PACK | ORAL | Status: DC
Start: 2018-02-10 — End: 2018-02-11

## 2018-02-11 MED ORDER — ALUM & MAG HYDROXIDE-SIMETH 200-200-20 MG/5ML PO SUSP
30.00 | ORAL | Status: DC
Start: ? — End: 2018-02-11

## 2018-02-11 MED ORDER — LABETALOL HCL 5 MG/ML IV SOLN
5.00 | INTRAVENOUS | Status: DC
Start: ? — End: 2018-02-11

## 2018-02-11 MED ORDER — METOPROLOL SUCCINATE ER 25 MG PO TB24
25.00 | ORAL_TABLET | ORAL | Status: DC
Start: 2018-02-10 — End: 2018-02-11

## 2018-02-11 MED ORDER — CLONAZEPAM 0.5 MG PO TABS
0.50 | ORAL_TABLET | ORAL | Status: DC
Start: ? — End: 2018-02-11

## 2018-02-11 MED ORDER — OXYCODONE HCL 10 MG PO TABS
10.00 | ORAL_TABLET | ORAL | Status: DC
Start: ? — End: 2018-02-11

## 2018-02-11 MED ORDER — POLYSACCHARIDE IRON COMPLEX 150 MG PO CAPS
150.00 | ORAL_CAPSULE | ORAL | Status: DC
Start: 2018-02-09 — End: 2018-02-11

## 2018-02-11 MED ORDER — ZOLPIDEM TARTRATE 5 MG PO TABS
5.00 | ORAL_TABLET | ORAL | Status: DC
Start: ? — End: 2018-02-11

## 2018-02-11 MED ORDER — LEVOTHYROXINE SODIUM 25 MCG PO TABS
25.00 | ORAL_TABLET | ORAL | Status: DC
Start: 2018-02-10 — End: 2018-02-11

## 2018-02-11 MED ORDER — BENZOCAINE-MENTHOL 15-3.6 MG MT LOZG
1.00 | LOZENGE | OROMUCOSAL | Status: DC
Start: ? — End: 2018-02-11

## 2018-02-11 MED ORDER — BISACODYL 10 MG RE SUPP
10.00 | RECTAL | Status: DC
Start: ? — End: 2018-02-11

## 2018-02-11 MED ORDER — GABAPENTIN 300 MG PO CAPS
300.00 | ORAL_CAPSULE | ORAL | Status: DC
Start: 2018-02-09 — End: 2018-02-11

## 2018-02-13 DIAGNOSIS — Z471 Aftercare following joint replacement surgery: Secondary | ICD-10-CM | POA: Diagnosis not present

## 2018-02-13 DIAGNOSIS — E039 Hypothyroidism, unspecified: Secondary | ICD-10-CM | POA: Diagnosis not present

## 2018-02-13 DIAGNOSIS — E785 Hyperlipidemia, unspecified: Secondary | ICD-10-CM | POA: Diagnosis not present

## 2018-02-13 DIAGNOSIS — Z96653 Presence of artificial knee joint, bilateral: Secondary | ICD-10-CM | POA: Diagnosis not present

## 2018-02-13 DIAGNOSIS — M170 Bilateral primary osteoarthritis of knee: Secondary | ICD-10-CM | POA: Diagnosis not present

## 2018-02-13 DIAGNOSIS — R55 Syncope and collapse: Secondary | ICD-10-CM | POA: Diagnosis not present

## 2018-02-13 DIAGNOSIS — I4891 Unspecified atrial fibrillation: Secondary | ICD-10-CM | POA: Diagnosis not present

## 2018-02-13 DIAGNOSIS — K5900 Constipation, unspecified: Secondary | ICD-10-CM | POA: Diagnosis not present

## 2018-02-13 DIAGNOSIS — M6281 Muscle weakness (generalized): Secondary | ICD-10-CM | POA: Diagnosis not present

## 2018-02-13 DIAGNOSIS — Z4789 Encounter for other orthopedic aftercare: Secondary | ICD-10-CM | POA: Diagnosis not present

## 2018-02-13 DIAGNOSIS — G4700 Insomnia, unspecified: Secondary | ICD-10-CM | POA: Diagnosis not present

## 2018-02-20 DIAGNOSIS — R55 Syncope and collapse: Secondary | ICD-10-CM | POA: Diagnosis not present

## 2018-02-20 DIAGNOSIS — Z4789 Encounter for other orthopedic aftercare: Secondary | ICD-10-CM | POA: Diagnosis not present

## 2018-02-20 DIAGNOSIS — M6281 Muscle weakness (generalized): Secondary | ICD-10-CM | POA: Diagnosis not present

## 2018-02-20 DIAGNOSIS — E039 Hypothyroidism, unspecified: Secondary | ICD-10-CM | POA: Diagnosis not present

## 2018-02-20 DIAGNOSIS — Z8546 Personal history of malignant neoplasm of prostate: Secondary | ICD-10-CM | POA: Diagnosis not present

## 2018-02-20 DIAGNOSIS — M170 Bilateral primary osteoarthritis of knee: Secondary | ICD-10-CM | POA: Diagnosis not present

## 2018-02-20 DIAGNOSIS — Z471 Aftercare following joint replacement surgery: Secondary | ICD-10-CM | POA: Diagnosis not present

## 2018-02-20 DIAGNOSIS — R269 Unspecified abnormalities of gait and mobility: Secondary | ICD-10-CM | POA: Diagnosis not present

## 2018-02-20 DIAGNOSIS — G4709 Other insomnia: Secondary | ICD-10-CM | POA: Diagnosis not present

## 2018-02-20 DIAGNOSIS — E785 Hyperlipidemia, unspecified: Secondary | ICD-10-CM | POA: Diagnosis not present

## 2018-02-20 DIAGNOSIS — I4891 Unspecified atrial fibrillation: Secondary | ICD-10-CM | POA: Diagnosis not present

## 2018-02-20 DIAGNOSIS — Z96653 Presence of artificial knee joint, bilateral: Secondary | ICD-10-CM | POA: Diagnosis not present

## 2018-02-20 DIAGNOSIS — M25561 Pain in right knee: Secondary | ICD-10-CM | POA: Diagnosis not present

## 2018-02-20 DIAGNOSIS — M25562 Pain in left knee: Secondary | ICD-10-CM | POA: Diagnosis not present

## 2018-02-20 DIAGNOSIS — G4700 Insomnia, unspecified: Secondary | ICD-10-CM | POA: Diagnosis not present

## 2018-02-25 DIAGNOSIS — I480 Paroxysmal atrial fibrillation: Secondary | ICD-10-CM | POA: Diagnosis not present

## 2018-02-25 DIAGNOSIS — I10 Essential (primary) hypertension: Secondary | ICD-10-CM | POA: Diagnosis not present

## 2018-02-25 DIAGNOSIS — G4700 Insomnia, unspecified: Secondary | ICD-10-CM | POA: Diagnosis not present

## 2018-02-25 DIAGNOSIS — Z79891 Long term (current) use of opiate analgesic: Secondary | ICD-10-CM | POA: Diagnosis not present

## 2018-02-25 DIAGNOSIS — Z96653 Presence of artificial knee joint, bilateral: Secondary | ICD-10-CM | POA: Diagnosis not present

## 2018-02-25 DIAGNOSIS — N400 Enlarged prostate without lower urinary tract symptoms: Secondary | ICD-10-CM | POA: Diagnosis not present

## 2018-02-25 DIAGNOSIS — E034 Atrophy of thyroid (acquired): Secondary | ICD-10-CM | POA: Diagnosis not present

## 2018-02-25 DIAGNOSIS — Z9181 History of falling: Secondary | ICD-10-CM | POA: Diagnosis not present

## 2018-02-25 DIAGNOSIS — Z471 Aftercare following joint replacement surgery: Secondary | ICD-10-CM | POA: Diagnosis not present

## 2018-02-25 DIAGNOSIS — Z8546 Personal history of malignant neoplasm of prostate: Secondary | ICD-10-CM | POA: Diagnosis not present

## 2018-02-25 DIAGNOSIS — E785 Hyperlipidemia, unspecified: Secondary | ICD-10-CM | POA: Diagnosis not present

## 2018-03-08 DIAGNOSIS — M25561 Pain in right knee: Secondary | ICD-10-CM | POA: Diagnosis not present

## 2018-03-08 DIAGNOSIS — M25562 Pain in left knee: Secondary | ICD-10-CM | POA: Diagnosis not present

## 2018-03-08 DIAGNOSIS — Z471 Aftercare following joint replacement surgery: Secondary | ICD-10-CM | POA: Diagnosis not present

## 2018-03-09 ENCOUNTER — Other Ambulatory Visit: Payer: Self-pay | Admitting: Family Medicine

## 2018-03-15 ENCOUNTER — Ambulatory Visit: Payer: Medicare Other | Admitting: Family Medicine

## 2018-03-22 DIAGNOSIS — M25562 Pain in left knee: Secondary | ICD-10-CM | POA: Diagnosis not present

## 2018-03-22 DIAGNOSIS — Z96653 Presence of artificial knee joint, bilateral: Secondary | ICD-10-CM | POA: Diagnosis not present

## 2018-03-22 DIAGNOSIS — M25561 Pain in right knee: Secondary | ICD-10-CM | POA: Diagnosis not present

## 2018-03-26 DIAGNOSIS — C61 Malignant neoplasm of prostate: Secondary | ICD-10-CM | POA: Diagnosis not present

## 2018-03-28 DIAGNOSIS — M25561 Pain in right knee: Secondary | ICD-10-CM | POA: Diagnosis not present

## 2018-03-30 DIAGNOSIS — M25561 Pain in right knee: Secondary | ICD-10-CM | POA: Diagnosis not present

## 2018-04-02 DIAGNOSIS — C61 Malignant neoplasm of prostate: Secondary | ICD-10-CM | POA: Diagnosis not present

## 2018-04-02 DIAGNOSIS — N529 Male erectile dysfunction, unspecified: Secondary | ICD-10-CM | POA: Diagnosis not present

## 2018-04-03 ENCOUNTER — Ambulatory Visit (INDEPENDENT_AMBULATORY_CARE_PROVIDER_SITE_OTHER): Payer: Medicare Other

## 2018-04-03 DIAGNOSIS — I480 Paroxysmal atrial fibrillation: Secondary | ICD-10-CM

## 2018-04-03 DIAGNOSIS — Z471 Aftercare following joint replacement surgery: Secondary | ICD-10-CM | POA: Diagnosis not present

## 2018-04-03 DIAGNOSIS — E034 Atrophy of thyroid (acquired): Secondary | ICD-10-CM | POA: Diagnosis not present

## 2018-04-03 DIAGNOSIS — I10 Essential (primary) hypertension: Secondary | ICD-10-CM

## 2018-04-04 DIAGNOSIS — M25561 Pain in right knee: Secondary | ICD-10-CM | POA: Diagnosis not present

## 2018-04-06 DIAGNOSIS — M25561 Pain in right knee: Secondary | ICD-10-CM | POA: Diagnosis not present

## 2018-04-10 DIAGNOSIS — M25561 Pain in right knee: Secondary | ICD-10-CM | POA: Diagnosis not present

## 2018-04-12 ENCOUNTER — Ambulatory Visit (INDEPENDENT_AMBULATORY_CARE_PROVIDER_SITE_OTHER): Payer: Medicare Other | Admitting: Family Medicine

## 2018-04-12 ENCOUNTER — Encounter: Payer: Self-pay | Admitting: Family Medicine

## 2018-04-12 VITALS — BP 119/67 | HR 63 | Temp 97.7°F | Ht 71.0 in | Wt 216.0 lb

## 2018-04-12 DIAGNOSIS — E7800 Pure hypercholesterolemia, unspecified: Secondary | ICD-10-CM | POA: Diagnosis not present

## 2018-04-12 DIAGNOSIS — M25561 Pain in right knee: Secondary | ICD-10-CM | POA: Diagnosis not present

## 2018-04-12 DIAGNOSIS — F5101 Primary insomnia: Secondary | ICD-10-CM | POA: Diagnosis not present

## 2018-04-12 DIAGNOSIS — Z96653 Presence of artificial knee joint, bilateral: Secondary | ICD-10-CM | POA: Diagnosis not present

## 2018-04-12 DIAGNOSIS — I4891 Unspecified atrial fibrillation: Secondary | ICD-10-CM | POA: Diagnosis not present

## 2018-04-12 DIAGNOSIS — E034 Atrophy of thyroid (acquired): Secondary | ICD-10-CM

## 2018-04-12 DIAGNOSIS — C61 Malignant neoplasm of prostate: Secondary | ICD-10-CM

## 2018-04-12 DIAGNOSIS — E559 Vitamin D deficiency, unspecified: Secondary | ICD-10-CM

## 2018-04-12 MED ORDER — SCOPOLAMINE 1 MG/3DAYS TD PT72: 1.0000 | MEDICATED_PATCH | TRANSDERMAL | 12 refills | Status: DC

## 2018-04-12 NOTE — Progress Notes (Signed)
Subjective:    Patient ID: Benjamin Maxwell, male    DOB: 11-Oct-1946, 72 y.o.   MRN: [id number]  HPI Pt here for follow up and management of chronic medical problems which includes a fib, hyperlipidemia and hypothyroid. He is taking medication regularly.  The patient is doing well overall he is status post bilateral knee replacement.  He will get lab work today.  He had a CT scan of the chest in October 2017.  The bilateral knee replacement was done by Dr. Lavone Neri at [facility] in [place] about 2 months ago.  He did have a cardiac clearance prior to that.  He was on heparin during the surgery and was placed on a blood thinner for a month after the surgery.  He is pleased with the results and he is currently getting therapy.  He also went recently to see his urologist and apparently an imaging study was done that revealed a nodule in the prostate gland and there plans in 4 to 5 months to have this removed and this is by Dr. [place] Crane.  The patient today denies any chest pain pressure tightness or shortness of breath.  He denies any trouble with swallowing including heartburn indigestion nausea vomiting diarrhea blood in the stool or black tarry bowel movements.  He is passing his water well.  He will follow-up with Dr. [place] Crane in a few months for this additional biopsy procedure.  His vision is up-to-date.  L     Patient Active Problem List   Diagnosis Date Noted  . 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 Atrial fibrillation ([place]) 09/05/2016  . Pure hypercholesterolemia 09/05/2016  . Prostate cancer ([place]) 09/05/2016  . 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 Obesity 11/24/2014  . Vitamin D deficiency 11/24/2014  . 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 Insomnia 10/03/2013  . prostate cancer 10/03/2013  . Hyperlipidemia 10/03/2013  . Hypothyroidism 10/03/2013  . Dysfunctions associated with sleep stages or arousal from sleep    Outpatient Encounter  Medications as of 04/12/2018  Medication Sig  . [name] 15 MG TABS TAKE 1 TABLET(15MG) BY MOUTH AT BEDTIME AS NEEDED.  Mar[place] Kitchen Cholecalciferol (VITAMIN D-1000 MAX ST) 1000 units tablet Take by mouth.  . clonazePAM (KLONOPIN) 0.5 MG tablet TAKE ONE-HALF TABLET BY MOUTH DAILY AS DIRECTED  . Eszopiclone 3 MG TABS TAKE ONE TABLET BY MOUTH IMMEDIATELY BEFORE BEDTIME  . levothyroxine (SYNTHROID, LEVOTHROID) 50 MCG tablet TAKE 1 TABLET BY MOUTH DAILY  . metoprolol succinate (TOPROL-XL) 25 MG 24 hr tablet TAKE 1 TABLET(25 MG) BY MOUTH DAILY  . Multiple Vitamin (MULTI-VITAMINS) TABS Take by mouth.  . Omega-3 1000 MG CAPS Take 1 g by mouth.  . QUEtiapine (SEROQUEL) 50 MG tablet TAKE 1 TABLET BY MOUTH AT BEDTIME. MUST KEEP AUGUST APPT  . rosuvastatin (CRESTOR) 10 MG tablet TAKE 1 TABLET BY MOUTH EVERY DAY   No facility-administered encounter medications on file as of 04/12/2018.      Review of Systems  Constitutional: Negative.   HENT: Negative.   Eyes: Negative.   Respiratory: Negative.   Cardiovascular: Negative.   Gastrointestinal: Negative.   Endocrine: Negative.   Genitourinary: Negative.   Musculoskeletal: Negative.   Skin: Negative.   Allergic/Immunologic: Negative.   Neurological: Negative.   Hematological: Negative.   Psychiatric/Behavioral: Negative.        Objective:   Physical Exam  Constitutional: He is oriented to person, place, and time. He appears well-developed and well-nourished. No distress.  The patient is pleasant and relaxed after having gone through bilateral knee replacement 2 months ago.  HENT:  Head: Normocephalic and atraumatic.  Right Ear: External ear normal.  Left Ear: External ear normal.  Nose: Nose normal.  Mouth/Throat: Oropharynx is clear and moist. No oropharyngeal exudate.  Eyes: Pupils are equal, round, and reactive to light. Conjunctivae and EOM are normal. Right eye exhibits no discharge. Left eye exhibits no discharge. No scleral icterus.  Neck:  Normal range of motion. Neck supple. No thyromegaly present.  No bruits thyromegaly or anterior cervical adenopathy  Cardiovascular: Normal rate, regular rhythm, normal heart sounds and intact distal pulses.  No murmur heard. Heart is regular at 72/min  Pulmonary/Chest: Effort normal and breath sounds normal. No respiratory distress. He has no wheezes. He has no rales. He exhibits no tenderness.  Lungs are clear anteriorly and posteriorly no axillary adenopathy and no chest wall masses  Abdominal: Soft. Bowel sounds are normal. He exhibits no mass. There is no tenderness.  The abdomen is soft without liver or spleen enlargement epigastric tenderness bruits or inguinal adenopathy  Genitourinary:  Genitourinary Comments: Patient is followed regularly and was last seen by Dr. Jonette Eva about 10 days ago.  Musculoskeletal: Normal range of motion. He exhibits no edema or tenderness.  Status post bilateral knee replacement and still some swelling but no more than would be expected at this point in time.  Good surgical repair and healing.  Lymphadenopathy:    He has no cervical adenopathy.  Neurological: He is alert and oriented to person, place, and time. He has normal reflexes. No cranial nerve deficit.  Skin: Skin is warm and dry. No erythema.  Psychiatric: He has a normal mood and affect. His behavior is normal. Judgment and thought content normal.  Patient is pleasant and alert and in good spirits and has normal mood and affect and behavior.  Nursing note and vitals reviewed.  BP 119/67 (BP Location: Left Arm)   Pulse 63   Temp 97.7 F (36.5 C) (Oral)   Ht _0  (1.803 m)   Wt 216 lb (98 kg)   BMI 30.13 kg/m         Assessment & Plan:  1. Pure hypercholesterolemia -Continue aggressive therapeutic lifestyle changes pending results of lab work.  Continue with Crestor - BMP8+EGFR - CBC with Differential/Platelet - Lipid panel - Hepatic function panel  2. Atrial fibrillation,  unspecified type [facility]) -Follow-up with cardiology as planned - BMP8+EGFR - CBC with Differential/Platelet  3. Prostate cancer [facility]) -Follow-up with Dr. Cecilie Lowers call as planned - CBC with Differential/Platelet  4. Vitamin D deficiency -Continue vitamin D replacement pending results of lab work - CBC with Differential/Platelet - VITAMIN D 25 Hydroxy (Vit-D Deficiency, Fractures)  5. Hypothyroidism due to acquired atrophy of thyroid -Continue with current treatment pending results of lab work - CBC with Differential/Platelet - Thyroid Panel With TSH  6. Status post bilateral knee replacements -Follow-up with orthopedics as planned and continue with physical therapy as recommended  7. Primary insomnia -Continue with current treatment  Patient Instructions                       Medicare Annual Wellness Visit  [facility] and the medical providers at [facility] strive to bring you the best medical care.  In doing so we not only want to address your current medical conditions and concerns but also to detect new conditions early and prevent illness,  disease and health-related problems.    Medicare offers a yearly Wellness Visit which allows our clinical staff to assess your need for preventative services including immunizations, lifestyle education, counseling to decrease risk of preventable diseases and screening for fall risk and other medical concerns.    This visit is provided free of charge (no copay) for all Medicare recipients. The clinical pharmacists at [facility] have begun to conduct these Wellness Visits which will also include a thorough review of all your medications.    As you primary medical provider recommend that you make an appointment for your Annual Wellness Visit if you have not done so already this year.  You may set up this appointment before you leave today or you may call back ([phone]) and schedule an appointment.   Please make sure when you call that you mention that you are scheduling your Annual Wellness Visit with the clinical pharmacist so that the appointment may be made for the proper length of time.     Continue current medications. Continue good therapeutic lifestyle changes which include good diet and exercise. Fall precautions discussed with patient. If an FOBT was given today- please return it to our front desk. If you are over 30 years old - you may need Prevnar 63 or the adult Pneumonia vaccine.  **Flu shots are available--- please call and schedule a FLU-CLINIC appointment**  After your visit with [place] today you will receive a survey in the mail or online from [employer] regarding your care with [place]. Please take a moment to fill this out. Your feedback is very important to [place] as you can help [place] better understand your patient needs as well as improve your experience and satisfaction. WE CARE ABOUT YOU!!!   Continue to follow-up with cardiology urology and orthopedics Stay as active physically as recommended by the orthopedist Be careful so as not to fall Move slowly Keep working on diet eating less carbs making all efforts to lose as much weight as possible You must work as aggressively as possible to lose the weight  Arrie Senate MD

## 2018-04-12 NOTE — Patient Instructions (Addendum)
Medicare Annual Wellness Visit  [facility] and the medical providers at [facility] strive to bring you the best medical care.  In doing so we not only want to address your current medical conditions and concerns but also to detect new conditions early and prevent illness, disease and health-related problems.    Medicare offers a yearly Wellness Visit which allows our clinical staff to assess your need for preventative services including immunizations, lifestyle education, counseling to decrease risk of preventable diseases and screening for fall risk and other medical concerns.    This visit is provided free of charge (no copay) for all Medicare recipients. The clinical pharmacists at [facility] have begun to conduct these Wellness Visits which will also include a thorough review of all your medications.    As you primary medical provider recommend that you make an appointment for your Annual Wellness Visit if you have not done so already this year.  You may set up this appointment before you leave today or you may call back ([phone]) and schedule an appointment.  Please make sure when you call that you mention that you are scheduling your Annual Wellness Visit with the clinical pharmacist so that the appointment may be made for the proper length of time.     Continue current medications. Continue good therapeutic lifestyle changes which include good diet and exercise. Fall precautions discussed with patient. If an FOBT was given today- please return it to our front desk. If you are over 30 years old - you may need Prevnar 38 or the adult Pneumonia vaccine.  **Flu shots are available--- please call and schedule a FLU-CLINIC appointment**  After your visit with [place] today you will receive a survey in the mail or online from [employer] regarding your care with [place]. Please take a moment to fill this out. Your feedback is very  important to [place] as you can help [place] better understand your patient needs as well as improve your experience and satisfaction. WE CARE ABOUT YOU!!!   Continue to follow-up with cardiology urology and orthopedics Stay as active physically as recommended by the orthopedist Be careful so as not to fall Move slowly Keep working on diet eating less carbs making all efforts to lose as much weight as possible You must work as aggressively as possible to lose the weight

## 2018-04-13 ENCOUNTER — Telehealth: Payer: Self-pay

## 2018-04-13 LAB — BMP8+EGFR
BUN / CREAT RATIO: 14 (ref 10–24)
BUN: 14 mg/dL (ref 8–27)
CHLORIDE: 102 mmol/L (ref 96–106)
CO2: 25 mmol/L (ref 20–29)
Calcium: 9.7 mg/dL (ref 8.6–10.2)
Creatinine, Ser: 0.99 mg/dL (ref 0.76–1.27)
GFR calc non Af Amer: 77 mL/min/{1.73_m2} (ref >59)
GFR, EST AFRICAN AMERICAN: 89 mL/min/{1.73_m2} (ref >59)
Glucose: 91 mg/dL (ref 65–99)
Potassium: 4.3 mmol/L (ref 3.5–5.2)
Sodium: 138 mmol/L (ref 134–144)

## 2018-04-13 LAB — CBC WITH DIFFERENTIAL/PLATELET
BASOS ABS: 0 10*3/uL (ref 0.0–0.2)
Basos: 0 %
EOS (ABSOLUTE): 0.2 10*3/uL (ref 0.0–0.4)
Eos: 2 %
HEMATOCRIT: 40.7 % (ref 37.5–51.0)
HEMOGLOBIN: 12.9 g/dL — AB (ref 13.0–17.7)
Immature Grans (Abs): 0 10*3/uL (ref 0.0–0.1)
Immature Granulocytes: 0 %
LYMPHS ABS: 2.2 10*3/uL (ref 0.7–3.1)
Lymphs: 26 %
MCH: 28.4 pg (ref 26.6–33.0)
MCHC: 31.7 g/dL (ref 31.5–35.7)
MCV: 90 fL (ref 79–97)
MONOCYTES: 6 %
Monocytes Absolute: 0.5 10*3/uL (ref 0.1–0.9)
NEUTROS ABS: 5.6 10*3/uL (ref 1.4–7.0)
Neutrophils: 66 %
Platelets: 287 10*3/uL (ref 150–450)
RBC: 4.55 x10E6/uL (ref 4.14–5.80)
RDW: 14.3 % (ref 12.3–15.4)
WBC: 8.5 10*3/uL (ref 3.4–10.8)

## 2018-04-13 LAB — THYROID PANEL WITH TSH
FREE THYROXINE INDEX: 1.6 (ref 1.2–4.9)
T3 UPTAKE RATIO: 26 % (ref 24–39)
T4 TOTAL: 6 ug/dL (ref 4.5–12.0)
TSH: 1.57 u[IU]/mL (ref 0.450–4.500)

## 2018-04-13 LAB — HEPATIC FUNCTION PANEL
ALT: 12 IU/L (ref 0–44)
AST: 16 IU/L (ref 0–40)
Albumin: 4.3 g/dL (ref 3.6–4.8)
Alkaline Phosphatase: 58 IU/L (ref 39–117)
Bilirubin Total: 0.7 mg/dL (ref 0.0–1.2)
Bilirubin, Direct: 0.19 mg/dL (ref 0.00–0.40)
TOTAL PROTEIN: 7.1 g/dL (ref 6.0–8.5)

## 2018-04-13 LAB — LIPID PANEL
CHOL/HDL RATIO: 2.3 ratio (ref 0.0–5.0)
CHOLESTEROL TOTAL: 123 mg/dL (ref 100–199)
HDL: 54 mg/dL (ref >39)
LDL CALC: 59 mg/dL (ref 0–99)
Triglycerides: 48 mg/dL (ref 0–149)
VLDL Cholesterol Cal: 10 mg/dL (ref 5–40)

## 2018-04-13 LAB — VITAMIN D 25 HYDROXY (VIT D DEFICIENCY, FRACTURES): VIT D 25 HYDROXY: 55.2 ng/mL (ref 30.0–100.0)

## 2018-04-13 NOTE — Telephone Encounter (Signed)
Insurance denied Transderm Scop patch     Alternative is Meclizine

## 2018-04-13 NOTE — Telephone Encounter (Signed)
LM earlier today about his Labs - 6/28-jhb

## 2018-04-13 NOTE — Telephone Encounter (Signed)
Please call this patient and let him know about the insurance denial of the Transderm scopolamine patch and see if he wants to pay for this or get meclizine instead.  If he wants meclizine you would use 12.5  4 times daily.  This is usually used for inner ear.  He may want to pay for the Transderm scopolamine out-of-pocket.

## 2018-04-17 ENCOUNTER — Other Ambulatory Visit: Payer: Self-pay | Admitting: *Deleted

## 2018-04-17 DIAGNOSIS — G4701 Insomnia due to medical condition: Secondary | ICD-10-CM

## 2018-04-17 DIAGNOSIS — G4750 Parasomnia, unspecified: Secondary | ICD-10-CM

## 2018-04-17 DIAGNOSIS — G4752 REM sleep behavior disorder: Secondary | ICD-10-CM

## 2018-04-17 DIAGNOSIS — G4759 Other parasomnia: Secondary | ICD-10-CM

## 2018-04-17 MED ORDER — ESZOPICLONE 3 MG PO TABS: ORAL_TABLET | ORAL | 1 refills | Status: DC

## 2018-04-17 MED ORDER — QUETIAPINE FUMARATE 50 MG PO TABS: ORAL_TABLET | ORAL | 3 refills | Status: DC

## 2018-05-28 ENCOUNTER — Ambulatory Visit: Payer: Medicare Other | Admitting: Neurology

## 2018-05-28 ENCOUNTER — Encounter: Payer: Self-pay | Admitting: Neurology

## 2018-05-28 VITALS — BP 124/75 | HR 62 | Ht 71.0 in | Wt 218.0 lb

## 2018-05-28 DIAGNOSIS — G4701 Insomnia due to medical condition: Secondary | ICD-10-CM

## 2018-05-28 DIAGNOSIS — G4759 Other parasomnia: Secondary | ICD-10-CM

## 2018-05-28 DIAGNOSIS — R29898 Other symptoms and signs involving the musculoskeletal system: Secondary | ICD-10-CM | POA: Diagnosis not present

## 2018-05-28 DIAGNOSIS — G4750 Parasomnia, unspecified: Secondary | ICD-10-CM | POA: Diagnosis not present

## 2018-05-28 DIAGNOSIS — G4752 REM sleep behavior disorder: Secondary | ICD-10-CM | POA: Diagnosis not present

## 2018-05-28 MED ORDER — QUETIAPINE FUMARATE 50 MG PO TABS: ORAL_TABLET | ORAL | 3 refills | Status: DC

## 2018-05-28 MED ORDER — ESZOPICLONE 3 MG PO TABS: ORAL_TABLET | ORAL | 1 refills | Status: DC

## 2018-05-28 MED ORDER — SUVOREXANT 15 MG PO TABS: 15.0000 mg | ORAL_TABLET | Freq: Every day | ORAL | 5 refills | Status: DC

## 2018-05-28 NOTE — Progress Notes (Signed)
[facility]  Provider:  Dr Tomoko Sandra Referring Provider: Chipper Herb, MD Primary Care Physician:  Chipper Herb, MD  Chief Complaint  Patient presents with  . Follow-up    pt alone, rm 11, pt states things are well.    Benjamin Maxwell is a 72 y.o. male here as a referral from Dr. Laurance Flatten for follow up on Parasomnia, treated with Klonopin. Seen in a RV on 05-28-2018, just returned from [place] and [place], and is in good health he has a mild resting tremor noted. Not pill-rolling tremor, and present when he lifts a coffee cup, holds a pen etc. He has no hoarseness, no dysphagia, and walks well on 2 "new knees ". PT was completed 3 weeks ago. He is climbing stairs well.     Parasomnia. Interval history from 05/23/2017, I have the pleasure of seeing Benjamin Maxwell, who has been a established patient with chronic insomnia. He has just returned from a trip to the [place]. He is in good health he continues to control sleep walking / parasomnias with Klonopin, and insomnia prn with Lunesta.   HPI: 05-19-2016. The patient has been followed here for almost 12  years. He is an active traveler and has just traveled again through [place], [place] and [place].  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 As past medical history I need to add  that he had altitude sickness while (2013)  in [place], when he reached the 5000 m range. His  insomnia in high altitude was probably related to central apnea . The patient's bedtime is around 10 PM he arises at about 6:30 in the morning, he break spontaneously and does not need an alarm. We can speak days a fairly similar in sleep times he may have one bathroom break at night. There is no history of falls at night of dizziness or light headedness. He failed Doxepin and Ambien, needs to go back on Lunesta.   Review of Systems: Out of a complete 14 system review, the patient complains of only the following symptoms, and all  other reviewed systems are negative. parasomnia , controlled.  No jet lag problem.   Social History   Socioeconomic History  . Marital status: Divorced    Spouse name: Not on file  . Number of children: 2  . Years of education: college  . Highest education level: Not on file  Occupational History  . Occupation: Tree surgeon    Comment: [employer]  Social Needs  . Financial resource strain: Not on file  . Food insecurity:    Worry: Not on file    Inability: Not on file  . Transportation needs:    Medical: Not on file    Non-medical: Not on file  Tobacco Use  . Smoking status: Never Smoker  . Smokeless tobacco: Never Used  Substance and Sexual Activity  . Alcohol use: Yes    Comment: Social  . Drug use: No  . Sexual activity: Not on file  Lifestyle  . Physical activity:    Days per week: Not on file    Minutes per session: Not on file  . Stress: Not on file  Relationships  . Social connections:    Talks on phone: Not on file    Gets together: Not on file    Attends religious service: Not on file    Active member of club or organization: Not on file    Attends meetings of clubs or organizations: Not  on file    Relationship status: Not on file  . Intimate partner violence:    Fear of current or ex partner: Not on file    Emotionally abused: Not on file    Physically abused: Not on file    Forced sexual activity: Not on file  Other Topics Concern  . Not on file  Social History Narrative   Patient is a Tree surgeon, works full time at [employer]. Patient has a college education. Patient is divorced.  Lives alone   Patient is right-handed.   Patient drinks very little caffeine (tea).    Family History  Problem Relation Age of Onset  . Atrial fibrillation Mother   . Pneumonia Father   . Cancer Father     Past Medical History:  Diagnosis Date  . Cancer [facility])    Prostate, followed by urology  . Diverticulosis   . Dysfunctions associated with sleep stages or  arousal from sleep   . Hyperlipidemia   . Hypothyroid   . Pulmonary nodules   . Spondylolysis   . Syncope and collapse     Past Surgical History:  Procedure Laterality Date  . KNEE SURGERY Bilateral   . UMBILICAL HERNIA REPAIR      Current Outpatient Medications  Medication Sig Dispense Refill  . [name] 15 MG TABS TAKE 1 TABLET(15MG ) BY MOUTH AT BEDTIME AS NEEDED. 30 tablet 0  . Cholecalciferol (VITAMIN D-1000 MAX ST) 1000 units tablet Take by mouth.    . clonazePAM (KLONOPIN) 0.5 MG tablet TAKE ONE-HALF TABLET BY MOUTH DAILY AS DIRECTED 45 tablet 1  . Eszopiclone 3 MG TABS TAKE ONE TABLET BY MOUTH IMMEDIATELY BEFORE BEDTIME 90 tablet 1  . levothyroxine (SYNTHROID, LEVOTHROID) 50 MCG tablet TAKE 1 TABLET BY MOUTH DAILY 90 tablet 3  . metoprolol succinate (TOPROL-XL) 25 MG 24 hr tablet TAKE 1 TABLET(25 MG) BY MOUTH DAILY 90 tablet 1  . Multiple Vitamin (MULTI-VITAMINS) TABS Take by mouth.    . Omega-3 1000 MG CAPS Take 1 g by mouth.    . QUEtiapine (SEROQUEL) 50 MG tablet TAKE 1 TABLET BY MOUTH AT BEDTIME. MUST KEEP AUGUST APPT 30 tablet 3  . rosuvastatin (CRESTOR) 10 MG tablet TAKE 1 TABLET BY MOUTH EVERY DAY 90 tablet 0   No current facility-administered medications for this visit.     Allergies as of 05/28/2018 - Review Complete 05/28/2018  Allergen Reaction Noted  . Levitra [vardenafil]  10/03/2013    Vitals: BP 124/75   Pulse 62   Ht 5\' 11"  (1.803 m)   Wt 218 lb (98.9 kg)   BMI 30.40 kg/m  Last Weight:  Wt Readings from Last 1 Encounters:  05/28/18 218 lb (98.9 kg)   Last Height:   Ht Readings from Last 1 Encounters:  05/28/18 5\' 11"  (1.803 m)   Vision Screening:  Left eye with correction.                                    Right eye with correction 20/20.  Physical exam:  General: The patient is awake, alert and appears not in acute distress. The patient is well groomed. Head: Normocephalic, atraumatic. Neck is supple. Mallampati 2, neck circumference:  40.9 , TMJ click on the right.  Cardiovascular:  Regular rate and rhythm, Skin:  Without evidence of edema, or rash Trunk: BMI is elevated, and patient  has normal posture.  Neurologic exam : The  patient is awake and alert, oriented to place and time.  Memory subjective  described as intact. There is a normal attention span & concentration ability.  Speech is fluent without  dysarthria, dysphonia or aphasia. Mood and affect are appropriate.no hallucinations.   Cranial nerves:Pupils are equal and briskly reactive to light. Funduscopic exam without  evidence of pallor or edema.  Extraocular movements  in vertical and horizontal planes intact and without nystagmus.  Visual fields by finger perimetry are intact.Hearing to finger rub intact.  Facial sensation intact to fine touch- Facial motor strength is symmetric and tongue and uvula move midline.  Motor exam: Normal tone and normal muscle bulk and symmetric normal strength in all extremities. No cog-wheeling.  Sensory:  Fine touch, pinprick and vibration were tested in all extremities. Proprioception is  normal. Coordination: Rapid alternating movements in the fingers/hands is tested and normal.  Finger-to-nose normal without evidence of ataxia, dysmetria or tremor. Gait and station: Patient walks without assistive device  Strength within normal limits.  Stance is stable and normal. Tandem gait is unfragmented. Romberg testing is normal.  Deep tendon reflexes: in the  upper and lower extremities are symmetric and intact.  Babinski maneuver response is downgoing.  Assessment:  After physical and neurologic examination, review of laboratory studies, and pre-existing records, assessment :   1) Insomnia 20 minute visit for refill, Lunesta, as he has failed Zolpidem and Doxepin.   2)Tremor developed over the last 7 years , mild, and may be essential or Seroquel related, no parkinsonian changes to tone and posture. No masked face.  3) Parasomnia.  Controlled on klonopin  4) atrophy of the thenar eminence - right dominant hand, loss of grip strength. But no reported numbness, no carpal tunnel symptoms. Should look with NCV and EMG at right upper extremity.      Plan:  Treatment plan and additional workup will be reviewed under Problem List.  Refilled Seroquel/ Lunesta for the next year. He liked [name]. Klonopin is prescribed by Dr Laurance Flatten. He is prn using Lunesta.   Melatonin is used as needed, 5 mg or less dose at night. No recent incidents.    Larey Seat, MD

## 2018-06-04 ENCOUNTER — Other Ambulatory Visit: Payer: Self-pay | Admitting: Family Medicine

## 2018-06-26 ENCOUNTER — Ambulatory Visit: Payer: Medicare Other | Admitting: Neurology

## 2018-06-26 ENCOUNTER — Encounter: Payer: Self-pay | Admitting: Neurology

## 2018-06-26 ENCOUNTER — Other Ambulatory Visit: Payer: Self-pay | Admitting: *Deleted

## 2018-06-26 ENCOUNTER — Ambulatory Visit (INDEPENDENT_AMBULATORY_CARE_PROVIDER_SITE_OTHER): Payer: Medicare Other | Admitting: Neurology

## 2018-06-26 DIAGNOSIS — R29898 Other symptoms and signs involving the musculoskeletal system: Secondary | ICD-10-CM

## 2018-06-26 DIAGNOSIS — G5601 Carpal tunnel syndrome, right upper limb: Secondary | ICD-10-CM

## 2018-06-26 DIAGNOSIS — G5623 Lesion of ulnar nerve, bilateral upper limbs: Secondary | ICD-10-CM

## 2018-06-26 DIAGNOSIS — G5620 Lesion of ulnar nerve, unspecified upper limb: Secondary | ICD-10-CM

## 2018-06-26 HISTORY — DX: Carpal tunnel syndrome, right upper limb: G56.01

## 2018-06-26 HISTORY — DX: Lesion of ulnar nerve, unspecified upper limb: G56.20

## 2018-06-26 NOTE — Progress Notes (Signed)
Please refer patient to hand and extremity specialist to further evaluate this patient in light of these findings.  He does live in [place] and may not prefer to go to [place].  We could use [facility] in [place] or fine extremity specialist at [facility].

## 2018-06-26 NOTE — Progress Notes (Signed)
Please refer this patient as requested

## 2018-06-26 NOTE — Progress Notes (Signed)
Please refer to EMG and nerve conduction study procedure note. 

## 2018-06-26 NOTE — Procedures (Signed)
     HISTORY:  Benjamin Maxwell is a 72 year old gentleman with a history of slight weakness of the right hand.  The patient himself has not noted any problems with use of the hand, he denies any numbness of the either hand or any pain from the neck going down the arms.  He is being evaluated for a possible neuropathy or a cervical radiculopathy.  NERVE CONDUCTION STUDIES:  Nerve conduction studies were performed on both upper extremities.  The distal motor latencies for the median nerves were slightly prolonged on the right, normal on the left, and slightly prolonged for the right ulnar nerve, borderline normal on the left.  The motor amplitudes for the median and ulnar nerves were normal bilaterally.  The nerve conduction velocities for the median nerves were normal bilaterally, slowed above the elbows bilaterally for the ulnar nerves.  The sensory latencies for the radial nerves were normal bilaterally, and were prolonged for the right median nerve, normal for the left median nerve.  The ulnar sensory latencies were prolonged bilaterally.  The F-wave latencies for the ulnar nerves were prolonged bilaterally.  EMG STUDIES:  EMG study was performed on the right upper extremity:  The first dorsal interosseous muscle reveals 2 to 4 K units with full recruitment. No fibrillations or positive waves were noted. The abductor pollicis brevis muscle reveals 2 to 4 K units with full recruitment. No fibrillations or positive waves were noted. The extensor indicis proprius muscle reveals 1 to 3 K units with full recruitment. No fibrillations or positive waves were noted. The pronator teres muscle reveals 2 to 3 K units with full recruitment. No fibrillations or positive waves were noted. The biceps muscle reveals 1 to 2 K units with full recruitment. No fibrillations or positive waves were noted. The triceps muscle reveals 2 to 4 K units with full recruitment. No fibrillations or positive waves were  noted. The anterior deltoid muscle reveals 2 to 3 K units with full recruitment. No fibrillations or positive waves were noted. The cervical paraspinal muscles were tested at 2 levels. No abnormalities of insertional activity were seen at either level tested. There was good relaxation.   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  There is evidence of a mild right carpal tunnel syndrome.  EMG evaluation of the right upper extremity was unremarkable, no evidence of an overlying cervical radiculopathy is seen.  Jill Alexanders MD 06/26/2018 1:43 PM  [facility] [address] [facility] [place], [place] [address]  Phone [phone] Fax [phone]

## 2018-06-26 NOTE — Progress Notes (Signed)
Pt called and referral placed

## 2018-06-26 NOTE — Progress Notes (Addendum)
Based on the report from Dr. Jannifer Franklin, and the ulnar neuropathies at both elbows, please arrange for the patient to see an extremity orthopedic surgeon specialist.  We can either go to [facility] or to Dr. Doran Durand at [facility] in [place].  I do not know the specialist in [place].  We can always call the hospital at [facility] or at [place] and find someone who can see him that is associated with this there.    [employer]    Nerve / Sites Muscle Latency Ref. Amplitude Ref. Rel Amp Segments Distance Velocity Ref. Area    ms ms mV mV %  cm m/s m/s mVms  R Median - APB     Wrist APB 4.2 ?4.4 7.1 ?4.0 100 Wrist - APB 7   22.2     Upper arm APB 9.0  6.5  91.6 Upper arm - Wrist 23 49 ?49 21.9  L Median - APB     Wrist APB 3.8 ?4.4 5.1 ?4.0 100 Wrist - APB 7   18.9     Upper arm APB 8.5  4.9  96.3 Upper arm - Wrist 23 49 ?49 20.7  R Ulnar - ADM     Wrist ADM 3.4 ?3.3 8.3 ?6.0 100 Wrist - ADM 7   20.9     B.Elbow ADM 7.8  7.3  87.8 B.Elbow - Wrist 21 49 ?49 19.3     A.Elbow ADM 10.5  4.3  58.1 A.Elbow - B.Elbow 10 36 ?49 13.3         A.Elbow - Wrist      L Ulnar - ADM     Wrist ADM 3.3 ?3.3 8.8 ?6.0 100 Wrist - ADM 7   22.0     B.Elbow ADM 7.6  6.9  77.8 B.Elbow - Wrist 21 49 ?49 18.6     A.Elbow ADM 9.9  5.6  82 A.Elbow - B.Elbow 10 43 ?49 15.8         A.Elbow - Wrist                 SNC    Nerve / Sites Rec. Site Peak Lat Ref.  Amp Ref. Segments Distance    ms ms V V  cm  R Radial - Anatomical snuff box (Forearm)     Forearm Wrist 2.8 ?2.9 11 ?15 Forearm - Wrist 10  L Radial - Anatomical snuff box (Forearm)     Forearm Wrist 2.8 ?2.9 17 ?15 Forearm - Wrist 10  R Median - Orthodromic (Dig II, Mid palm)     Dig II Wrist 6.4 ?3.4 2 ?10 Dig II - Wrist 13  L Median - Orthodromic (Dig II, Mid palm)     Dig II Wrist 3.3 ?3.4 13 ?10 Dig II - Wrist 13  R Ulnar - Orthodromic, (Dig V, Mid palm)     Dig V Wrist 3.4 ?3.1 2 ?5 Dig V - Wrist 11  L Ulnar - Orthodromic, (Dig V, Mid  palm)     Dig V Wrist 3.6 ?3.1 4 ?5 Dig V - Wrist 24                 F  Wave    Nerve F Lat Ref.   ms ms  R Ulnar - ADM 35.0 ?32.0  L Ulnar - ADM 33.7 ?32.0

## 2018-06-27 ENCOUNTER — Telehealth: Payer: Self-pay | Admitting: Neurology

## 2018-06-27 NOTE — Telephone Encounter (Signed)
Called the patient and there was no answer. LVM for the patient informing that the test showed mild right sided carpal tunnel syndrome. Informed him that it looked like his PCP has already seen this and are in the works of placing a referral to orthro for him to have this addressed. LVM informing him to call back if wanting to discuss further or if he has questions.

## 2018-06-27 NOTE — Telephone Encounter (Signed)
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  There is evidence of a mild right sided carpal tunnel syndrome.   EMG  evaluation of the right upper extremity was unremarkable, no  evidence of an overlying cervical radiculopathy is seen.

## 2018-06-27 NOTE — Progress Notes (Signed)
Referral placed.

## 2018-07-09 DIAGNOSIS — S63641S Sprain of metacarpophalangeal joint of right thumb, sequela: Secondary | ICD-10-CM | POA: Diagnosis not present

## 2018-07-09 DIAGNOSIS — S63641D Sprain of metacarpophalangeal joint of right thumb, subsequent encounter: Secondary | ICD-10-CM | POA: Diagnosis not present

## 2018-07-09 DIAGNOSIS — M25342 Other instability, left hand: Secondary | ICD-10-CM | POA: Diagnosis not present

## 2018-07-09 DIAGNOSIS — M25341 Other instability, right hand: Secondary | ICD-10-CM | POA: Diagnosis not present

## 2018-07-09 DIAGNOSIS — M1811 Unilateral primary osteoarthritis of first carpometacarpal joint, right hand: Secondary | ICD-10-CM | POA: Diagnosis not present

## 2018-07-24 ENCOUNTER — Encounter: Payer: Self-pay | Admitting: Neurology

## 2018-09-11 ENCOUNTER — Ambulatory Visit (INDEPENDENT_AMBULATORY_CARE_PROVIDER_SITE_OTHER): Payer: Medicare Other

## 2018-09-11 DIAGNOSIS — Z23 Encounter for immunization: Secondary | ICD-10-CM

## 2018-09-12 ENCOUNTER — Ambulatory Visit: Payer: Medicare Other | Admitting: Family Medicine

## 2018-09-18 ENCOUNTER — Other Ambulatory Visit: Payer: Self-pay | Admitting: Neurology

## 2018-09-19 ENCOUNTER — Other Ambulatory Visit: Payer: Self-pay | Admitting: Neurology

## 2018-09-19 ENCOUNTER — Telehealth: Payer: Self-pay | Admitting: Neurology

## 2018-09-19 MED ORDER — CLONAZEPAM 0.5 MG PO TABS: ORAL_TABLET | ORAL | 1 refills | Status: DC

## 2018-09-19 NOTE — Telephone Encounter (Signed)
Yes, I will take it over.

## 2018-09-19 NOTE — Telephone Encounter (Signed)
Pt requesting refills for clonazePAM (KLONOPIN) 0.5 MG tablet sent to [facility]. Stating he no longer sees the prescribing MD. Please advise

## 2018-09-20 MED ORDER — CLONAZEPAM 0.5 MG PO TABS: ORAL_TABLET | ORAL | 3 refills | Status: DC

## 2018-09-20 NOTE — Addendum Note (Signed)
Addended by: Larey Seat on: 09/20/2018 03:24 PM   Modules accepted: Orders

## 2018-09-25 ENCOUNTER — Other Ambulatory Visit: Payer: Self-pay | Admitting: Family Medicine

## 2018-10-04 DIAGNOSIS — M25561 Pain in right knee: Secondary | ICD-10-CM | POA: Diagnosis not present

## 2018-10-15 ENCOUNTER — Ambulatory Visit: Payer: Medicare Other | Admitting: Family Medicine

## 2018-11-07 ENCOUNTER — Ambulatory Visit: Payer: Medicare Other | Admitting: Family Medicine

## 2018-11-08 DIAGNOSIS — C61 Malignant neoplasm of prostate: Secondary | ICD-10-CM | POA: Diagnosis not present

## 2018-11-08 DIAGNOSIS — Z192 Hormone resistant malignancy status: Secondary | ICD-10-CM | POA: Diagnosis not present

## 2018-11-14 ENCOUNTER — Other Ambulatory Visit: Payer: Self-pay | Admitting: Urology

## 2018-11-14 DIAGNOSIS — C61 Malignant neoplasm of prostate: Secondary | ICD-10-CM

## 2018-11-15 DIAGNOSIS — M25561 Pain in right knee: Secondary | ICD-10-CM | POA: Diagnosis not present

## 2018-11-28 ENCOUNTER — Ambulatory Visit
Admission: RE | Admit: 2018-11-28 | Discharge: 2018-11-28 | Disposition: A | Discharge status: Home / Self Care | Payer: Medicare Other | Source: Ambulatory Visit | Attending: Urology | Admitting: Urology

## 2018-11-28 DIAGNOSIS — C61 Malignant neoplasm of prostate: Secondary | ICD-10-CM

## 2018-11-28 DIAGNOSIS — R972 Elevated prostate specific antigen [PSA]: Secondary | ICD-10-CM | POA: Diagnosis not present

## 2018-11-28 MED ORDER — GADOBENATE DIMEGLUMINE 529 MG/ML IV SOLN
20.0000 mL | Freq: Once | INTRAVENOUS | Status: AC | PRN
  Administered 2018-11-28: 20 mL via INTRAVENOUS

## 2018-12-05 DIAGNOSIS — C61 Malignant neoplasm of prostate: Secondary | ICD-10-CM | POA: Diagnosis not present

## 2018-12-21 DIAGNOSIS — C61 Malignant neoplasm of prostate: Secondary | ICD-10-CM | POA: Diagnosis not present

## 2018-12-26 DIAGNOSIS — C61 Malignant neoplasm of prostate: Secondary | ICD-10-CM | POA: Diagnosis not present

## 2018-12-26 DIAGNOSIS — N529 Male erectile dysfunction, unspecified: Secondary | ICD-10-CM | POA: Diagnosis not present

## 2018-12-27 ENCOUNTER — Other Ambulatory Visit: Payer: Self-pay | Admitting: Family Medicine

## 2018-12-27 NOTE — Telephone Encounter (Signed)
Needs to be seen for any further refills 

## 2019-01-12 ENCOUNTER — Other Ambulatory Visit: Payer: Self-pay | Admitting: Family Medicine

## 2019-01-12 DIAGNOSIS — G4750 Parasomnia, unspecified: Secondary | ICD-10-CM

## 2019-01-12 DIAGNOSIS — G4759 Other parasomnia: Secondary | ICD-10-CM

## 2019-01-12 DIAGNOSIS — G4701 Insomnia due to medical condition: Secondary | ICD-10-CM

## 2019-01-12 DIAGNOSIS — G4752 REM sleep behavior disorder: Secondary | ICD-10-CM

## 2019-01-14 ENCOUNTER — Other Ambulatory Visit: Payer: Self-pay | Admitting: Neurology

## 2019-01-14 ENCOUNTER — Telehealth: Payer: Self-pay | Admitting: Neurology

## 2019-01-14 DIAGNOSIS — G4759 Other parasomnia: Secondary | ICD-10-CM

## 2019-01-14 DIAGNOSIS — G4750 Parasomnia, unspecified: Secondary | ICD-10-CM

## 2019-01-14 DIAGNOSIS — G4752 REM sleep behavior disorder: Secondary | ICD-10-CM

## 2019-01-14 DIAGNOSIS — G4701 Insomnia due to medical condition: Secondary | ICD-10-CM

## 2019-01-14 MED ORDER — ESZOPICLONE 3 MG PO TABS: ORAL_TABLET | ORAL | 1 refills | Status: DC

## 2019-01-14 NOTE — Telephone Encounter (Signed)
I have received this request and have printed for Dr Brett Fairy to sign and send to the pharmacy for the patient. Will send tomorrow 01/15/2019 for the patient.

## 2019-01-14 NOTE — Telephone Encounter (Signed)
Pt called in for a refill of Eszopiclone 3 MG TABS to be sent to [facility] [place] - [address] AT [address]

## 2019-01-14 NOTE — Telephone Encounter (Signed)
Controlled needs to be prescribed by PCP

## 2019-01-14 NOTE — Telephone Encounter (Signed)
Last seen 04/12/18  DWM

## 2019-01-15 ENCOUNTER — Other Ambulatory Visit: Payer: Self-pay | Admitting: *Deleted

## 2019-01-15 DIAGNOSIS — G4759 Other parasomnia: Secondary | ICD-10-CM

## 2019-01-15 DIAGNOSIS — G4750 Parasomnia, unspecified: Secondary | ICD-10-CM

## 2019-01-15 DIAGNOSIS — G4752 REM sleep behavior disorder: Secondary | ICD-10-CM

## 2019-01-15 DIAGNOSIS — G4701 Insomnia due to medical condition: Secondary | ICD-10-CM

## 2019-01-15 MED ORDER — ESZOPICLONE 3 MG PO TABS: ORAL_TABLET | ORAL | 1 refills | Status: DC

## 2019-01-15 NOTE — Telephone Encounter (Signed)
Please refill this prescription for this patient with refills

## 2019-01-16 NOTE — Telephone Encounter (Signed)
Prior authorization for eszopiclone 3mg  submitted through covermymeds.   Key: [id number]

## 2019-01-16 NOTE — Telephone Encounter (Signed)
Pt called in and stated he needs a PA for med listed below

## 2019-01-17 ENCOUNTER — Telehealth: Payer: Self-pay | Admitting: Neurology

## 2019-01-17 NOTE — Telephone Encounter (Signed)
Benjamin Maxwell from [facility] called with the approval number for the Eszopiclone 3 MG TABS  Approval is: [facility]

## 2019-02-14 DIAGNOSIS — M25562 Pain in left knee: Secondary | ICD-10-CM | POA: Diagnosis not present

## 2019-02-14 DIAGNOSIS — M25561 Pain in right knee: Secondary | ICD-10-CM | POA: Diagnosis not present

## 2019-03-01 ENCOUNTER — Other Ambulatory Visit: Payer: Self-pay | Admitting: Family Medicine

## 2019-03-19 ENCOUNTER — Other Ambulatory Visit: Payer: Self-pay | Admitting: Family Medicine

## 2019-04-04 ENCOUNTER — Other Ambulatory Visit: Payer: Self-pay | Admitting: Neurology

## 2019-04-04 ENCOUNTER — Other Ambulatory Visit: Payer: Self-pay | Admitting: Family Medicine

## 2019-04-04 DIAGNOSIS — G4750 Parasomnia, unspecified: Secondary | ICD-10-CM

## 2019-04-04 DIAGNOSIS — G4752 REM sleep behavior disorder: Secondary | ICD-10-CM

## 2019-04-04 DIAGNOSIS — G4759 Other parasomnia: Secondary | ICD-10-CM

## 2019-04-04 DIAGNOSIS — G4701 Insomnia due to medical condition: Secondary | ICD-10-CM

## 2019-04-04 NOTE — Telephone Encounter (Signed)
NOV 04/11/19

## 2019-04-09 DIAGNOSIS — H2513 Age-related nuclear cataract, bilateral: Secondary | ICD-10-CM | POA: Diagnosis not present

## 2019-04-10 ENCOUNTER — Other Ambulatory Visit: Payer: Medicare Other

## 2019-04-10 ENCOUNTER — Other Ambulatory Visit: Payer: Self-pay

## 2019-04-10 DIAGNOSIS — E559 Vitamin D deficiency, unspecified: Secondary | ICD-10-CM | POA: Diagnosis not present

## 2019-04-10 DIAGNOSIS — E034 Atrophy of thyroid (acquired): Secondary | ICD-10-CM

## 2019-04-10 DIAGNOSIS — C61 Malignant neoplasm of prostate: Secondary | ICD-10-CM | POA: Diagnosis not present

## 2019-04-10 DIAGNOSIS — E7800 Pure hypercholesterolemia, unspecified: Secondary | ICD-10-CM

## 2019-04-10 LAB — MICROSCOPIC EXAMINATION
Bacteria, UA: NONE SEEN
Epithelial Cells (non renal): NONE SEEN /hpf (ref 0–10)
Renal Epithel, UA: NONE SEEN /hpf

## 2019-04-10 LAB — LIPID PANEL

## 2019-04-10 LAB — URINALYSIS, ROUTINE W REFLEX MICROSCOPIC
Bilirubin, UA: NEGATIVE
Glucose, UA: NEGATIVE
Ketones, UA: NEGATIVE
Leukocytes,UA: NEGATIVE
Nitrite, UA: NEGATIVE
Protein,UA: NEGATIVE
Specific Gravity, UA: 1.02 (ref 1.005–1.030)
Urobilinogen, Ur: 0.2 mg/dL (ref 0.2–1.0)
pH, UA: 5.5 (ref 5.0–7.5)

## 2019-04-10 LAB — CBC WITH DIFFERENTIAL/PLATELET

## 2019-04-11 ENCOUNTER — Encounter: Payer: Self-pay | Admitting: Family Medicine

## 2019-04-11 ENCOUNTER — Ambulatory Visit (INDEPENDENT_AMBULATORY_CARE_PROVIDER_SITE_OTHER): Payer: Medicare Other | Admitting: Family Medicine

## 2019-04-11 DIAGNOSIS — E782 Mixed hyperlipidemia: Secondary | ICD-10-CM

## 2019-04-11 DIAGNOSIS — G4750 Parasomnia, unspecified: Secondary | ICD-10-CM

## 2019-04-11 DIAGNOSIS — F513 Sleepwalking [somnambulism]: Secondary | ICD-10-CM

## 2019-04-11 DIAGNOSIS — C61 Malignant neoplasm of prostate: Secondary | ICD-10-CM

## 2019-04-11 DIAGNOSIS — G4701 Insomnia due to medical condition: Secondary | ICD-10-CM

## 2019-04-11 DIAGNOSIS — E034 Atrophy of thyroid (acquired): Secondary | ICD-10-CM | POA: Diagnosis not present

## 2019-04-11 DIAGNOSIS — I480 Paroxysmal atrial fibrillation: Secondary | ICD-10-CM

## 2019-04-11 DIAGNOSIS — Z8489 Family history of other specified conditions: Secondary | ICD-10-CM

## 2019-04-11 DIAGNOSIS — Z1211 Encounter for screening for malignant neoplasm of colon: Secondary | ICD-10-CM

## 2019-04-11 DIAGNOSIS — G4752 REM sleep behavior disorder: Secondary | ICD-10-CM

## 2019-04-11 DIAGNOSIS — G4759 Other parasomnia: Secondary | ICD-10-CM

## 2019-04-11 DIAGNOSIS — E559 Vitamin D deficiency, unspecified: Secondary | ICD-10-CM

## 2019-04-11 DIAGNOSIS — Z96653 Presence of artificial knee joint, bilateral: Secondary | ICD-10-CM

## 2019-04-11 DIAGNOSIS — F5101 Primary insomnia: Secondary | ICD-10-CM

## 2019-04-11 LAB — CBC WITH DIFFERENTIAL/PLATELET
Basophils Absolute: 0 10*3/uL (ref 0.0–0.2)
Basos: 1 %
EOS (ABSOLUTE): 0.2 10*3/uL (ref 0.0–0.4)
Eos: 2 %
Hematocrit: 42 % (ref 37.5–51.0)
Hemoglobin: 14 g/dL (ref 13.0–17.7)
Immature Grans (Abs): 0 10*3/uL (ref 0.0–0.1)
Immature Granulocytes: 0 %
Lymphocytes Absolute: 2.5 10*3/uL (ref 0.7–3.1)
Lymphs: 35 %
MCH: 29.8 pg (ref 26.6–33.0)
MCHC: 33.3 g/dL (ref 31.5–35.7)
MCV: 89 fL (ref 79–97)
Monocytes Absolute: 0.6 10*3/uL (ref 0.1–0.9)
Monocytes: 9 %
Neutrophils Absolute: 3.7 10*3/uL (ref 1.4–7.0)
Neutrophils: 53 %
Platelets: 213 10*3/uL (ref 150–450)
RBC: 4.7 x10E6/uL (ref 4.14–5.80)
RDW: 12.3 % (ref 11.6–15.4)
WBC: 7.1 10*3/uL (ref 3.4–10.8)

## 2019-04-11 LAB — HEPATIC FUNCTION PANEL
ALT: 14 IU/L (ref 0–44)
AST: 13 IU/L (ref 0–40)
Albumin: 4.2 g/dL (ref 3.8–4.8)
Alkaline Phosphatase: 50 IU/L (ref 39–117)
Bilirubin Total: 0.6 mg/dL (ref 0.0–1.2)
Bilirubin, Direct: 0.19 mg/dL (ref 0.00–0.40)
Total Protein: 6.8 g/dL (ref 6.0–8.5)

## 2019-04-11 LAB — BMP8+EGFR
BUN/Creatinine Ratio: 17 (ref 10–24)
BUN: 19 mg/dL (ref 8–27)
CO2: 23 mmol/L (ref 20–29)
Calcium: 9.7 mg/dL (ref 8.6–10.2)
Chloride: 104 mmol/L (ref 96–106)
Creatinine, Ser: 1.11 mg/dL (ref 0.76–1.27)
GFR calc Af Amer: 77 mL/min/{1.73_m2} (ref >59)
GFR calc non Af Amer: 67 mL/min/{1.73_m2} (ref >59)
Glucose: 103 mg/dL — ABNORMAL HIGH (ref 65–99)
Potassium: 4 mmol/L (ref 3.5–5.2)
Sodium: 141 mmol/L (ref 134–144)

## 2019-04-11 LAB — LIPID PANEL
Chol/HDL Ratio: 2.6 ratio (ref 0.0–5.0)
Cholesterol, Total: 143 mg/dL (ref 100–199)
HDL: 55 mg/dL (ref >39)
LDL Calculated: 75 mg/dL (ref 0–99)
Triglycerides: 66 mg/dL (ref 0–149)
VLDL Cholesterol Cal: 13 mg/dL (ref 5–40)

## 2019-04-11 LAB — THYROID PANEL WITH TSH
Free Thyroxine Index: 1.9 (ref 1.2–4.9)
T3 Uptake Ratio: 28 % (ref 24–39)
T4, Total: 6.7 ug/dL (ref 4.5–12.0)
TSH: 1.36 u[IU]/mL (ref 0.450–4.500)

## 2019-04-11 LAB — VITAMIN D 25 HYDROXY (VIT D DEFICIENCY, FRACTURES): Vit D, 25-Hydroxy: 45.7 ng/mL (ref 30.0–100.0)

## 2019-04-11 MED ORDER — BELSOMRA 15 MG PO TABS: 1.0000 | ORAL_TABLET | Freq: Every evening | ORAL | 3 refills | Status: DC | PRN

## 2019-04-11 MED ORDER — VASCEPA 1 G PO CAPS: 2.0000 | ORAL_CAPSULE | Freq: Two times a day (BID) | ORAL | 3 refills | Status: AC

## 2019-04-11 NOTE — Progress Notes (Signed)
Virtual Visit Via telephone Note I connected with@ on 04/11/19 by telephone and verified that I am speaking with the correct person or authorized healthcare agent using two identifiers. Benjamin Maxwell is currently located at home and there are no unauthorized people in close proximity. I completed this visit while in a private location in my home .  This visit type was conducted due to national recommendations for restrictions regarding the COVID-19 Pandemic (e.g. social distancing).  This format is felt to be most appropriate for this patient at this time.  All issues noted in this document were discussed and addressed.  No physical exam was performed.    I discussed the limitations, risks, security and privacy concerns of performing an evaluation and management service by telephone and the availability of in person appointments. I also discussed with the patient that there may be a patient responsible charge related to this service. The patient expressed understanding and agreed to proceed.   Date:  04/11/2019    ID:  Benjamin Maxwell      06/19/1946        [id number]   Patient Care Team Patient Care Team: Chipper Herb, MD as PCP - General (Family Medicine)  Reason for Visit: Primary Care Follow-up     History of Present Illness & Review of Systems:     Benjamin Maxwell is a 73 y.o. year old male primary care patient that presents today for a telehealth visit.  The patient is doing well overall other than gaining more weight.  He says he is gained about 20 pounds of weight since he was last seen and was somewhat upset about this.  We have all ways had problems with weight gain in this patient.  He does deny any chest pain or palpitations.  He denies any shortness of breath anymore than usual.  He denies any trouble with swallowing heartburn indigestion nausea vomiting diarrhea blood in the stool or black tarry bowel movements.  He is still in need of getting a colonoscopy and we will  hopefully get this arranged for him.  He is passing his water well.  He recently had an eye exam and everything was good with this.  Review of systems as stated, otherwise negative.  The patient does not have symptoms concerning for COVID-19 infection (fever, chills, cough, or new shortness of breath).      Current Medications (Verified) Allergies as of 04/11/2019      Reactions   Levitra [vardenafil]       Medication List       Accurate as of April 11, 2019 11:42 AM. If you have any questions, ask your nurse or doctor.        [name] 15 MG Tabs Generic drug: Suvorexant TAKE 1 TABLET BY MOUTH AT BEDTIME AS NEEDED   clonazePAM 0.5 MG tablet Commonly known as: KLONOPIN TAKE ONE-HALF TABLET BY MOUTH DAILY AS DIRECTED   Eszopiclone 3 MG Tabs TAKE ONE TABLET BY MOUTH IMMEDIATELY BEFORE BEDTIME   levothyroxine 50 MCG tablet Commonly known as: SYNTHROID TAKE 1 TABLET BY MOUTH DAILY   metoprolol succinate 25 MG 24 hr tablet Commonly known as: TOPROL-XL TAKE 1 TABLET(25 MG) BY MOUTH DAILY   Multi-Vitamins Tabs Take by mouth.   Omega-3 1000 MG Caps Take 1 g by mouth.   QUEtiapine 50 MG tablet Commonly known as: SEROQUEL TAKE 1 TABLET BY MOUTH AT BEDTIME. MUST KEEP AUGUST APPT   rosuvastatin 10 MG tablet Commonly known as:  CRESTOR TAKE 1 TABLET BY MOUTH EVERY DAY   Vitamin D-1000 Max St 25 MCG (1000 UT) tablet Generic drug: Cholecalciferol Take by mouth.           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     Past Surgical History:  Procedure Laterality Date    KNEE SURGERY Bilateral    UMBILICAL HERNIA REPAIR      Social History   Socioeconomic History   Marital status: Divorced    Spouse name: Not on file   Number of children: 2   Years of education: college   Highest education level: Not on file  Occupational History   Occupation: Tree surgeon    Comment: [employer]  Social Needs   Emergency planning/management officer strain: Not on file   Food insecurity    Worry: Not on file    Inability: Not on file   Transportation needs    Medical: Not on file    Non-medical: Not on file  Tobacco Use   Smoking status: Never Smoker   Smokeless tobacco: Never Used  Substance and Sexual Activity   Alcohol use: Yes    Comment: Social   Drug use: No   Sexual activity: Not on file  Lifestyle   Physical activity    Days per week: Not on file    Minutes per session: Not on file   Stress: Not on file  Relationships   Social connections    Talks on phone: Not on file    Gets together: Not on file    Attends religious service: Not on file    Active member of club or organization: Not on file    Attends meetings of clubs or organizations: Not on file    Relationship status: Not on file  Other Topics Concern   Not on file  Social History Narrative   Patient is a Tree surgeon, works full time at [employer]. Patient has a college education. Patient is divorced.  Lives alone   Patient is right-handed.   Patient drinks very little caffeine (tea).     Family History  Problem Relation Age of Onset   Atrial fibrillation Mother    Pneumonia Father    Cancer Father       Labs/Other Tests and Data Reviewed:    Wt Readings from Last 3 Encounters:  05/28/18 218 lb (98.9 kg)  04/12/18 216 lb (98 kg)  09/13/17 231 lb (104.8 kg)   Temp Readings from Last 3 Encounters:  04/12/18 97.7 F (36.5 C) (Oral)  09/13/17 98.1 F (36.7 C) (Oral)  03/09/17 98.6 F (37 C) (Oral)   BP Readings from Last 3 Encounters:  05/28/18 124/75  04/12/18  119/67  09/13/17 119/77   Pulse Readings from Last 3 Encounters:  05/28/18 62  04/12/18 63  09/13/17 65     No results found for: HGBA1C Lab Results  Component Value Date   LDLCALC 75 04/10/2019   CREATININE 1.11 04/10/2019       Chemistry      Component Value Date/Time  NA 141 04/10/2019 1124   K 4.0 04/10/2019 1124   CL 104 04/10/2019 1124   CO2 23 04/10/2019 1124   BUN 19 04/10/2019 1124   CREATININE 1.11 04/10/2019 1124      Component Value Date/Time   CALCIUM 9.7 04/10/2019 1124   ALKPHOS 50 04/10/2019 1124   AST 13 04/10/2019 1124   ALT 14 04/10/2019 1124   BILITOT 0.6 04/10/2019 1124         OBSERVATIONS/ OBJECTIVE:     The patient was alert and pleasant.  He does not check blood pressures at home.  I encouraged him to get an Om[employer] monitor and check his blood pressures periodically.  He also does not have a scales or check his weight regularly and he promises to do better with weight control.  He has not been running any fever.  Physical exam deferred due to nature of telephonic visit.  ASSESSMENT & PLAN    Time:   Today, I have spent 30 minutes with the patient via telephone discussing the above including Covid precautions.     Visit Diagnoses: 1. Paroxysmal atrial fibrillation (HCC) -Need to follow-up regularly with the cardiologist  2. Hypothyroidism due to acquired atrophy of thyroid -Continue current treatment pending results of lab work  3. Sleep walking disorder -Follow-up with neurology as planned  4. Prostate cancer [facility]) -Follow-up with Dr. Jonette Eva as planned  5. Mixed hyperlipidemia -Continue aggressive therapeutic lifestyle changes and statin therapy and try a new prescription for triglycerides called Vascepa at 2 g twice daily if insurance will cover this  6. Vitamin D deficiency -Continue vitamin D replacement pending results of lab work  7. Status post bilateral knee replacements -Follow-up with orthopedics as  needed  8. Primary insomnia -Continue with Jerrye Noble or with Lunesta and follow-up with neurology because of sleepwalking  9. Family history of sudden death -Aggressive management of risk factors with diet and exercise to achieve weight loss  Patient Instructions  Continue to follow-up regularly with cardiology Continue to follow-up regularly with urology because of prostate cancer Continue to follow-up regularly because of sleep issues with neurology Continue to drink plenty of fluids and stay well-hydrated and make every effort to lose weight through diet and exercise We will arrange to get colonoscopy with Dr. Billie Lade in [place]. Patient will come to the office for routine lab work including an FOBT and an update on his pneumonia vaccine. We will also look at starting Vascepa 2 g twice daily if he has insurance coverage for this. We will also schedule a visit with Dr. Dorris Fetch who does primary care in [facility] which is closer to where he lives.     The above assessment and management plan was discussed with the patient. The patient verbalized understanding of and has agreed to the management plan. Patient is aware to call the clinic if symptoms persist or worsen. Patient is aware when to return to the clinic for a follow-up visit. Patient educated on when it is appropriate to go to the emergency department.    Chipper Herb, MD [address], [place], [place] [address] Ph [phone]   Arrie Senate MD

## 2019-04-11 NOTE — Addendum Note (Signed)
Addended by: Zannie Cove on: 04/11/2019 01:29 PM   Modules accepted: Orders

## 2019-04-11 NOTE — Patient Instructions (Addendum)
Continue to follow-up regularly with cardiology Continue to follow-up regularly with urology because of prostate cancer Continue to follow-up regularly because of sleep issues with neurology Continue to drink plenty of fluids and stay well-hydrated and make every effort to lose weight through diet and exercise We will arrange to get colonoscopy with Dr. Billie Lade in [place]. Patient will come to the office for routine lab work including an FOBT and an update on his pneumonia vaccine. We will also look at starting Vascepa 2 g twice daily if he has insurance coverage for this. We will also schedule a visit with Dr. Dorris Fetch who does primary care in [facility] which is closer to where he lives.

## 2019-04-11 NOTE — Addendum Note (Signed)
Addended by: Chipper Herb on: 04/11/2019 01:33 PM   Modules accepted: Orders

## 2019-04-12 ENCOUNTER — Telehealth: Payer: Self-pay | Admitting: Internal Medicine

## 2019-04-12 NOTE — Telephone Encounter (Signed)
[place] with me if okay with Dr. Carlean Purl

## 2019-04-12 NOTE — Telephone Encounter (Signed)
Left message for patient to call back.   This RN referred back to Benjamin Maxwell for clarification of phone note (per [place]): more than 10 years ago, patient was seen by Carlean Purl, a new referral was received for the patient to be seen by Pyrtle, patient also wants to be seen by Pyrtle. Patient to be scheduled by Benjamin Maxwell after receiving clearance that patient can be seen by a different provider.

## 2019-04-12 NOTE — Telephone Encounter (Signed)
OK 

## 2019-04-12 NOTE — Telephone Encounter (Signed)
It is fine to schedule with Dr. Hilarie Fredrickson

## 2019-04-12 NOTE — Telephone Encounter (Signed)
Per Pt he has seen Dr. Carlean Purl for colonscopy 10+ received referral patient requesting to Dr. Hilarie Fredrickson for recall colon is switch okay.Please  Advise.

## 2019-04-15 ENCOUNTER — Encounter: Payer: Self-pay | Admitting: Internal Medicine

## 2019-04-15 NOTE — Telephone Encounter (Signed)
Dr. Carlean Purl and Pyrtle do you approve of change?

## 2019-04-15 NOTE — Telephone Encounter (Signed)
We have both said yes already

## 2019-04-15 NOTE — Telephone Encounter (Signed)
Patient called in and stated that he had a colon with Dr.Gessner 70yrs ago. However I did not see the exact dated of last colon under procedures patient is wanting to know the date of the last colon that he had. Please advise thanks

## 2019-04-22 ENCOUNTER — Other Ambulatory Visit: Payer: Self-pay

## 2019-04-22 ENCOUNTER — Ambulatory Visit (AMBULATORY_SURGERY_CENTER): Payer: Self-pay

## 2019-04-22 VITALS — Ht 71.0 in | Wt 220.0 lb

## 2019-04-22 DIAGNOSIS — Z1211 Encounter for screening for malignant neoplasm of colon: Secondary | ICD-10-CM

## 2019-04-22 MED ORDER — NA SULFATE-K SULFATE-MG SULF 17.5-3.13-1.6 GM/177ML PO SOLN: 1.0000 | Freq: Once | ORAL | 0 refills | Status: AC

## 2019-04-22 NOTE — Progress Notes (Signed)
Denies allergies to eggs or soy products. Denies complication of anesthesia or sedation. Denies use of weight loss medication. Denies use of O2.   Emmi instructions given for colonoscopy.  Pre-Visit was conducted by phone due to Covid 19. Instructions were reviewed and mailed to patients confirmed home address. Patient was encouraged to call if he had any questions regarding instructions. Patient states that he did not have a care partner for this appointment. I did stress to the patient that we had to have a care partner so that we could sedate him for the procedure. Patient states that he would work on finding someone to come with him.

## 2019-04-23 ENCOUNTER — Encounter: Payer: Self-pay | Admitting: Internal Medicine

## 2019-04-26 ENCOUNTER — Telehealth: Payer: Self-pay | Admitting: Internal Medicine

## 2019-04-26 NOTE — Telephone Encounter (Signed)

## 2019-04-29 ENCOUNTER — Encounter: Payer: Medicare Other | Admitting: Internal Medicine

## 2019-05-03 ENCOUNTER — Other Ambulatory Visit: Payer: Self-pay | Admitting: Neurology

## 2019-05-09 DIAGNOSIS — I483 Typical atrial flutter: Secondary | ICD-10-CM | POA: Diagnosis not present

## 2019-05-09 DIAGNOSIS — Q231 Congenital insufficiency of aortic valve: Secondary | ICD-10-CM | POA: Diagnosis not present

## 2019-05-09 DIAGNOSIS — I351 Nonrheumatic aortic (valve) insufficiency: Secondary | ICD-10-CM | POA: Diagnosis not present

## 2019-05-23 ENCOUNTER — Telehealth: Payer: Self-pay | Admitting: Neurology

## 2019-05-23 NOTE — Telephone Encounter (Signed)
I called patient regarding rescheduling his 8/13 appointment, due to Dr. Brett Fairy being out of office.Requested patient call us back to reschedule.

## 2019-05-28 DIAGNOSIS — C61 Malignant neoplasm of prostate: Secondary | ICD-10-CM | POA: Diagnosis not present

## 2019-05-30 ENCOUNTER — Ambulatory Visit: Payer: Medicare Other | Admitting: Neurology

## 2019-06-05 DIAGNOSIS — N529 Male erectile dysfunction, unspecified: Secondary | ICD-10-CM | POA: Diagnosis not present

## 2019-06-05 DIAGNOSIS — C61 Malignant neoplasm of prostate: Secondary | ICD-10-CM | POA: Diagnosis not present

## 2019-06-07 ENCOUNTER — Telehealth: Payer: Self-pay | Admitting: Internal Medicine

## 2019-06-07 NOTE — Telephone Encounter (Signed)

## 2019-06-10 ENCOUNTER — Encounter: Payer: Self-pay | Admitting: Internal Medicine

## 2019-06-10 ENCOUNTER — Other Ambulatory Visit: Payer: Self-pay

## 2019-06-10 ENCOUNTER — Ambulatory Visit (AMBULATORY_SURGERY_CENTER): Payer: Medicare Other | Admitting: Internal Medicine

## 2019-06-10 VITALS — BP 123/71 | HR 60 | Temp 97.8°F | Resp 15 | Ht 71.0 in | Wt 220.0 lb

## 2019-06-10 DIAGNOSIS — Z1211 Encounter for screening for malignant neoplasm of colon: Secondary | ICD-10-CM | POA: Diagnosis not present

## 2019-06-10 DIAGNOSIS — D125 Benign neoplasm of sigmoid colon: Secondary | ICD-10-CM | POA: Diagnosis not present

## 2019-06-10 DIAGNOSIS — D123 Benign neoplasm of transverse colon: Secondary | ICD-10-CM | POA: Diagnosis not present

## 2019-06-10 DIAGNOSIS — D124 Benign neoplasm of descending colon: Secondary | ICD-10-CM

## 2019-06-10 MED ORDER — SODIUM CHLORIDE 0.9 % IV SOLN: 500.0000 mL | Freq: Once | INTRAVENOUS | Status: DC

## 2019-06-10 NOTE — Op Note (Signed)
[facility] Patient Name: Benjamin Maxwell Procedure Date: 06/10/2019 2:47 PM MRN: [id number] Endoscopist: Jerene Bears , MD Age: 73 Referring MD:  Date of Birth: Feb 23, 1946 Gender: Male Account #: [id number] Procedure:                Colonoscopy Indications:              Screening for colorectal malignant neoplasm, Last                            colonoscopy: 2008 Medicines:                Monitored Anesthesia Care Procedure:                Pre-Anesthesia Assessment:                           - Prior to the procedure, a History and Physical                            was performed, and patient medications and                            allergies were reviewed. The patient's tolerance of                            previous anesthesia was also reviewed. The risks                            and benefits of the procedure and the sedation                            options and risks were discussed with the patient.                            All questions were answered, and informed consent                            was obtained. Prior Anticoagulants: The patient has                            taken no previous anticoagulant or antiplatelet                            agents. ASA Grade Assessment: II - A patient with                            mild systemic disease. After reviewing the risks                            and benefits, the patient was deemed in                            satisfactory condition to undergo the procedure.  After obtaining informed consent, the colonoscope                            was passed under direct vision. Throughout the                            procedure, the patient's blood pressure, pulse, and                            oxygen saturations were monitored continuously. The                            Colonoscope was introduced through the anus and                            advanced to the cecum, identified by appendiceal                             orifice and ileocecal valve. The colonoscopy was                            performed without difficulty. The patient tolerated                            the procedure well. The quality of the bowel                            preparation was good. The ileocecal valve,                            appendiceal orifice, and rectum were photographed. Scope In: 2:49:08 PM Scope Out: 3:07:40 PM Scope Withdrawal Time: 0 hours 14 minutes 49 seconds  Total Procedure Duration: 0 hours 18 minutes 32 seconds  Findings:                 The digital rectal exam was normal.                           Two sessile polyps were found in the splenic                            flexure. The polyps were 3 to 4 mm in size. These                            polyps were removed with a cold snare. Resection                            and retrieval were complete.                           A 4 mm polyp was found in the descending colon. The                            polyp was sessile.  The polyp was removed with a                            cold snare. Resection and retrieval were complete.                           A 6 mm polyp was found in the sigmoid colon. The                            polyp was sessile. The polyp was removed with a                            cold snare. Resection and retrieval were complete.                           Multiple small and large-mouthed diverticula were                            found in the sigmoid colon and hepatic flexure.                           Internal hemorrhoids were found during                            retroflexion. The hemorrhoids were small. Complications:            No immediate complications. Estimated Blood Loss:     Estimated blood loss was minimal. Impression:               - Two 3 to 4 mm polyps at the splenic flexure,                            removed with a cold snare. Resected and retrieved.                           - One 4 mm  polyp in the descending colon, removed                            with a cold snare. Resected and retrieved.                           - One 6 mm polyp in the sigmoid colon, removed with                            a cold snare. Resected and retrieved.                           - Diverticulosis in the sigmoid colon and at the                            hepatic flexure.                           - Small internal hemorrhoids.  Recommendation:           - Patient has a contact number available for                            emergencies. The signs and symptoms of potential                            delayed complications were discussed with the                            patient. Return to normal activities tomorrow.                            Written discharge instructions were provided to the                            patient.                           - Resume previous diet.                           - Continue present medications.                           - Await pathology results.                           - Repeat colonoscopy is recommended for                            surveillance. The colonoscopy date will be                            determined after pathology results from today's                            exam become available for review. Jerene Bears, MD 06/10/2019 3:10:48 PM This report has been signed electronically.

## 2019-06-10 NOTE — Patient Instructions (Signed)
Information on polyps, diverticulosis and hemorrhoids given to you today.  Await pathology results.  YOU HAD AN ENDOSCOPIC PROCEDURE TODAY AT THE [facility]:   Refer to the procedure report that was given to you for any specific questions about what was found during the examination.  If the procedure report does not answer your questions, please call your gastroenterologist to clarify.  If you requested that your care partner not be given the details of your procedure findings, then the procedure report has been included in a sealed envelope for you to review at your convenience later.  YOU SHOULD EXPECT: Some feelings of bloating in the abdomen. Passage of more gas than usual.  Walking can help get rid of the air that was put into your GI tract during the procedure and reduce the bloating. If you had a lower endoscopy (such as a colonoscopy or flexible sigmoidoscopy) you may notice spotting of blood in your stool or on the toilet paper. If you underwent a bowel prep for your procedure, you may not have a normal bowel movement for a few days.  Please Note:  You might notice some irritation and congestion in your nose or some drainage.  This is from the oxygen used during your procedure.  There is no need for concern and it should clear up in a day or so.  SYMPTOMS TO REPORT IMMEDIATELY:   Following lower endoscopy (colonoscopy or flexible sigmoidoscopy):  Excessive amounts of blood in the stool  Significant tenderness or worsening of abdominal pains  Swelling of the abdomen that is new, acute  Fever of 100F or higher   For urgent or emergent issues, a gastroenterologist can be reached at any hour by calling [phone].   DIET:  We do recommend a small meal at first, but then you may proceed to your regular diet.  Drink plenty of fluids but you should avoid alcoholic beverages for 24 hours.  ACTIVITY:  You should plan to take it easy for the rest of today and you should NOT  DRIVE or use heavy machinery until tomorrow (because of the sedation medicines used during the test).    FOLLOW UP: Our staff will call the number listed on your records 48-72 hours following your procedure to check on you and address any questions or concerns that you may have regarding the information given to you following your procedure. If we do not reach you, we will leave a message.  We will attempt to reach you two times.  During this call, we will ask if you have developed any symptoms of COVID 19. If you develop any symptoms (ie: fever, flu-like symptoms, shortness of breath, cough etc.) before then, please call [phone].  If you test positive for Covid 19 in the 2 weeks post procedure, please call and report this information to us.    If any biopsies were taken you will be contacted by phone or by letter within the next 1-3 weeks.  Please call us at [phone] if you have not heard about the biopsies in 3 weeks.    SIGNATURES/CONFIDENTIALITY: You and/or your care partner have signed paperwork which will be entered into your electronic medical record.  These signatures attest to the fact that that the information above on your After Visit Summary has been reviewed and is understood.  Full responsibility of the confidentiality of this discharge information lies with you and/or your care-partner. 

## 2019-06-10 NOTE — Telephone Encounter (Signed)
Pt responded "no" to all questions.  °

## 2019-06-10 NOTE — Progress Notes (Signed)
JB- Temp CW- Vitals 

## 2019-06-10 NOTE — Progress Notes (Signed)
Pt's states no medical or surgical changes since previsit or office visit. 

## 2019-06-10 NOTE — Progress Notes (Signed)
Called to room to assist during endoscopic procedure.  Patient ID and intended procedure confirmed with present staff. Received instructions for my participation in the procedure from the performing physician.  

## 2019-06-12 ENCOUNTER — Telehealth: Payer: Self-pay | Admitting: *Deleted

## 2019-06-12 NOTE — Telephone Encounter (Signed)
First follow up call attempt.  Reached non-identifying answering machine.  No message left. 

## 2019-06-12 NOTE — Telephone Encounter (Signed)
  Follow up Call-  Call back number 06/10/2019  Post procedure Call Back phone  # [phone]  Permission to leave phone message Yes  Some recent data might be hidden     Patient questions:  Do you have a fever, pain , or abdominal swelling? No. Pain Score  0 *  Have you tolerated food without any problems? Yes.    Have you been able to return to your normal activities? Yes.    Do you have any questions about your discharge instructions: Diet   No. Medications  No. Follow up visit  No.  Do you have questions or concerns about your Care? No.  Actions: * If pain score is 4 or above: No action needed, pain <4.   1. Have you developed a fever since your procedure? no  2.   Have you had an respiratory symptoms (SOB or cough) since your procedure? no  3.   Have you tested positive for COVID 19 since your procedure no 4.   Have you had any family members/close contacts diagnosed with the COVID 19 since your procedure?  no   If yes to any of these questions please route to Joylene John, RN and Alphonsa Gin, Therapist, sports.

## 2019-06-13 ENCOUNTER — Encounter: Payer: Self-pay | Admitting: Internal Medicine

## 2019-06-14 ENCOUNTER — Other Ambulatory Visit: Payer: Self-pay | Admitting: Family Medicine

## 2019-06-29 ENCOUNTER — Other Ambulatory Visit: Payer: Self-pay | Admitting: Family Medicine

## 2019-07-02 ENCOUNTER — Other Ambulatory Visit: Payer: Self-pay | Admitting: Neurology

## 2019-07-02 DIAGNOSIS — G4759 Other parasomnia: Secondary | ICD-10-CM

## 2019-07-02 DIAGNOSIS — G4701 Insomnia due to medical condition: Secondary | ICD-10-CM

## 2019-07-02 DIAGNOSIS — G4752 REM sleep behavior disorder: Secondary | ICD-10-CM

## 2019-07-02 DIAGNOSIS — G4750 Parasomnia, unspecified: Secondary | ICD-10-CM

## 2019-07-02 MED ORDER — ESZOPICLONE 3 MG PO TABS: ORAL_TABLET | ORAL | 1 refills | Status: DC

## 2019-07-04 ENCOUNTER — Other Ambulatory Visit: Payer: Self-pay | Admitting: Family Medicine

## 2019-07-04 ENCOUNTER — Other Ambulatory Visit: Payer: Self-pay

## 2019-07-04 ENCOUNTER — Ambulatory Visit (INDEPENDENT_AMBULATORY_CARE_PROVIDER_SITE_OTHER): Payer: Medicare Other

## 2019-07-04 DIAGNOSIS — G4750 Parasomnia, unspecified: Secondary | ICD-10-CM

## 2019-07-04 DIAGNOSIS — G4752 REM sleep behavior disorder: Secondary | ICD-10-CM

## 2019-07-04 DIAGNOSIS — G4759 Other parasomnia: Secondary | ICD-10-CM

## 2019-07-04 DIAGNOSIS — Z23 Encounter for immunization: Secondary | ICD-10-CM

## 2019-07-04 DIAGNOSIS — G4701 Insomnia due to medical condition: Secondary | ICD-10-CM

## 2019-07-29 ENCOUNTER — Other Ambulatory Visit: Payer: Self-pay | Admitting: Family Medicine

## 2019-07-29 DIAGNOSIS — G4701 Insomnia due to medical condition: Secondary | ICD-10-CM

## 2019-07-29 DIAGNOSIS — G4752 REM sleep behavior disorder: Secondary | ICD-10-CM

## 2019-07-29 DIAGNOSIS — G4750 Parasomnia, unspecified: Secondary | ICD-10-CM

## 2019-07-29 DIAGNOSIS — G4759 Other parasomnia: Secondary | ICD-10-CM

## 2019-08-08 ENCOUNTER — Ambulatory Visit: Payer: Medicare Other

## 2019-08-08 DIAGNOSIS — C61 Malignant neoplasm of prostate: Secondary | ICD-10-CM | POA: Diagnosis not present

## 2019-08-08 DIAGNOSIS — M25561 Pain in right knee: Secondary | ICD-10-CM | POA: Diagnosis not present

## 2019-08-08 DIAGNOSIS — Q231 Congenital insufficiency of aortic valve: Secondary | ICD-10-CM | POA: Diagnosis not present

## 2019-08-08 DIAGNOSIS — Z96653 Presence of artificial knee joint, bilateral: Secondary | ICD-10-CM | POA: Diagnosis not present

## 2019-08-30 DIAGNOSIS — G8929 Other chronic pain: Secondary | ICD-10-CM | POA: Diagnosis not present

## 2019-08-30 DIAGNOSIS — M25561 Pain in right knee: Secondary | ICD-10-CM | POA: Diagnosis not present

## 2019-08-30 DIAGNOSIS — R29898 Other symptoms and signs involving the musculoskeletal system: Secondary | ICD-10-CM | POA: Diagnosis not present

## 2019-08-30 DIAGNOSIS — M25562 Pain in left knee: Secondary | ICD-10-CM | POA: Diagnosis not present

## 2019-09-02 ENCOUNTER — Encounter: Payer: Self-pay | Admitting: Neurology

## 2019-09-02 ENCOUNTER — Ambulatory Visit: Payer: Medicare Other | Admitting: Neurology

## 2019-09-02 ENCOUNTER — Other Ambulatory Visit: Payer: Self-pay

## 2019-09-02 VITALS — BP 133/76 | HR 72 | Temp 97.1°F | Ht 71.0 in | Wt 240.0 lb

## 2019-09-02 DIAGNOSIS — G4750 Parasomnia, unspecified: Secondary | ICD-10-CM

## 2019-09-02 DIAGNOSIS — R29898 Other symptoms and signs involving the musculoskeletal system: Secondary | ICD-10-CM | POA: Diagnosis not present

## 2019-09-02 DIAGNOSIS — G4701 Insomnia due to medical condition: Secondary | ICD-10-CM

## 2019-09-02 DIAGNOSIS — G4752 REM sleep behavior disorder: Secondary | ICD-10-CM | POA: Diagnosis not present

## 2019-09-02 DIAGNOSIS — G4754 Parasomnia in conditions classified elsewhere: Secondary | ICD-10-CM | POA: Diagnosis not present

## 2019-09-02 DIAGNOSIS — M25562 Pain in left knee: Secondary | ICD-10-CM | POA: Diagnosis not present

## 2019-09-02 DIAGNOSIS — G4759 Other parasomnia: Secondary | ICD-10-CM

## 2019-09-02 DIAGNOSIS — M25561 Pain in right knee: Secondary | ICD-10-CM | POA: Diagnosis not present

## 2019-09-02 DIAGNOSIS — G8929 Other chronic pain: Secondary | ICD-10-CM | POA: Diagnosis not present

## 2019-09-02 MED ORDER — ESZOPICLONE 3 MG PO TABS: ORAL_TABLET | ORAL | 1 refills | Status: DC

## 2019-09-02 MED ORDER — QUETIAPINE FUMARATE 50 MG PO TABS: ORAL_TABLET | ORAL | 1 refills | Status: DC

## 2019-09-02 MED ORDER — CLONAZEPAM 0.5 MG PO TABS: ORAL_TABLET | ORAL | 3 refills | Status: DC

## 2019-09-02 MED ORDER — BELSOMRA 15 MG PO TABS: 1.0000 | ORAL_TABLET | Freq: Every evening | ORAL | 3 refills | Status: DC | PRN

## 2019-09-02 NOTE — Progress Notes (Signed)
[facility]  Provider:  Dr Brett Fairy                                SLEEP MEDICINE CLINIC  Referring Provider: Chipper Herb, MD Primary Care Physician:  Dorris Fetch, MD  Chief Complaint  Patient presents with  . Follow-up    pt alone, rm 10. pt states things are well no concerns    09-02-2019, Mr. 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   Zayyan Trimarco is a 73 y.o. male here as a referral from Dr. Laurance Flatten for follow up on Parasomnia, treated with Klonopin. Seen in a RV on 05-28-2018, just returned from [place] and [place], and is in good health he has a mild resting tremor noted. Not pill-rolling tremor, and present when he lifts a coffee cup, holds a pen etc. He has no hoarseness, no dysphagia, and walks well on 2 "new knees ". PT was completed 3 weeks ago. He is climbing stairs well.   Parasomnia. Interval history from 05/23/2017, I have the pleasure of seeing Mr. Keef, who has been a established patient with chronic insomnia. He has just returned from a trip to the [place]. He is in good health he continues to control sleep walking / parasomnias with Klonopin, and insomnia prn with Lunesta.   HPI: 05-19-2016. The patient has been followed here for almost 12 years. He is an active traveler and has just traveled again through [place], [place] and [place].  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 As past medical history I need to add  that he had altitude sickness while (2013)  in [place], when he reached the 5000 m range. His  insomnia in high altitude was probably related to central apnea . The patient's bedtime is around 10 PM he arises at about 6:30 in the morning, he break spontaneously and does not need an alarm. We can speak days a fairly similar in sleep times he may have one bathroom break at night. There is no  history of falls at night of dizziness or light headedness. He failed Doxepin and Ambien, needs to go back on Lunesta.   Review of Systems: Out of a complete 14 system review, the patient complains of only the following symptoms, and all other reviewed systems are negative. parasomnia , controlled on Klonopin. Marland Kitchen No jet lag problem.   Social History   Socioeconomic History  . Marital status: Divorced    Spouse name: Not on file  . Number of children: 2  . Years of education: college  . Highest education level: Not on file  Occupational History  . Occupation: Tree surgeon    Comment: [employer]  Social Needs  . Financial resource strain: Not on file  . Food insecurity    Worry: Not on file    Inability: Not on file  . Transportation needs    Medical: Not on file    Non-medical: Not on file  Tobacco Use  . Smoking status: Never Smoker  . Smokeless tobacco: Never Used  Substance and Sexual Activity  . Alcohol use: Yes    Comment: Social  . Drug use: No  . Sexual activity: Not on file  Lifestyle  . Physical activity    Days per week: Not on file    Minutes  per session: Not on file  . Stress: Not on file  Relationships  . Social Herbalist on phone: Not on file    Gets together: Not on file    Attends religious service: Not on file    Active member of club or organization: Not on file    Attends meetings of clubs or organizations: Not on file    Relationship status: Not on file  . Intimate partner violence    Fear of current or ex partner: Not on file    Emotionally abused: Not on file    Physically abused: Not on file    Forced sexual activity: Not on file  Other Topics Concern  . Not on file  Social History Narrative   Patient is a Tree surgeon, works full time at [employer]. Patient has a college education. Patient is divorced.  Lives alone   Patient is right-handed.   Patient drinks very little caffeine (tea).    Family History  Problem Relation Age  of Onset  . Atrial fibrillation Mother   . Pneumonia Father   . Cancer Father   . Colon cancer Neg Hx   . Esophageal cancer Neg Hx   . Rectal cancer Neg Hx   . Stomach cancer Neg Hx     Past Medical History:  Diagnosis Date  . Allergy   . Arthritis   . Cancer [facility])    Prostate, followed by urology  . CHF (congestive heart failure) ([place])   . Diverticulosis   . 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 Hyperlipidemia   . Hypothyroid   . Pulmonary nodules   . Right carpal tunnel syndrome 06/26/2018  . Spondylolysis   . Syncope and collapse   . 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 CARDIAC ELECTROPHYSIOLOGY STUDY AND ABLATION    . KNEE SURGERY Bilateral   . UMBILICAL HERNIA REPAIR      Current Outpatient Medications  Medication Sig Dispense Refill  . Cholecalciferol (VITAMIN D-1000 MAX ST) 1000 units tablet Take by mouth.    . clonazePAM (KLONOPIN) 0.5 MG tablet TAKE 1/2 TABLET BY MOUTH DAILY AS DIRECTED 45 tablet 0  . Eszopiclone 3 MG TABS TAKE ONE TABLET BY MOUTH IMMEDIATELY BEFORE BEDTIME 90 tablet 1  . Icosapent Ethyl (VASCEPA) 1 g CAPS Take 2 capsules (2 g total) by mouth 2 (two) times daily. 120 capsule 3  . levothyroxine (SYNTHROID) 50 MCG tablet TAKE 1 TABLET BY MOUTH DAILY 90 tablet 2  . metoprolol succinate (TOPROL-XL) 25 MG 24 hr tablet TAKE 1 TABLET(25 MG) BY MOUTH DAILY 90 tablet 3  . Multiple Vitamin (MULTI-VITAMINS) TABS Take by mouth.    . QUEtiapine (SEROQUEL) 50 MG tablet TAKE 1 TABLET BY MOUTH EVERY NIGHT AT BEDTIME (Please make 6 mos Dec appt) 30 tablet 1  . rosuvastatin (CRESTOR) 10 MG tablet TAKE 1 TABLET BY MOUTH EVERY DAY 90 tablet 0  . Suvorexant ([name]) 15 MG TABS Take 1 tablet by mouth at bedtime as needed. 30 tablet 3   No current facility-administered medications for this visit.     Allergies as of 09/02/2019 - Review Complete 09/02/2019  Allergen  Reaction Noted  . Levitra [vardenafil]  10/03/2013    Vitals: BP 133/76   Pulse 72   Temp (!) 97.1 F (36.2 C)   Ht 5\' 11"  (1.803 m)   Wt 240 lb (108.9  kg)   BMI 33.47 kg/m  Last Weight:  Wt Readings from Last 1 Encounters:  09/02/19 240 lb (108.9 kg)   Last Height:   Ht Readings from Last 1 Encounters:  09/02/19 5\' 11"  (1.803 m)   Vision Screening:  Left eye with correction.                                    Right eye with correction 20/20.  Physical exam:  General: The patient is awake, alert and appears not in acute distress. The patient is well groomed. Head: Normocephalic, atraumatic. Neck is supple. Mallampati 2, neck circumference: 0000000 , TMJ click on the right.  Cardiovascular:  Regular rate and rhythm, Skin:  Without evidence of edema, or rash Trunk: BMI is elevated, and patient  has normal posture.  Neurologic exam : The patient is awake and alert, oriented to place and time.  Memory subjective  described as intact. There is a normal attention span & concentration ability.  Speech is fluent without  dysarthria, dysphonia or aphasia. Mood and affect are appropriate.no hallucinations.   Cranial nerves: Pupils are equal and briskly reactive to light.  Funduscopic deferred.  Extraocular movements  in vertical and horizontal planes intact and without nystagmus.  Visual fields by finger perimetry are intact. Hearing to finger rub intact.   Facial sensation intact to fine touch- Facial motor strength is symmetric and tongue and uvula move midline.  Motor exam: Normal tone and normal muscle bulk and symmetric normal strength in all extremities. No cog-wheeling.  Sensory:  Fine touch, pinprick and vibration were  normal. Coordination: Rapid alternating movements in the fingers/hands is tested and normal.  Finger-to-nose normal without evidence of ataxia, dysmetria or tremor. Gait and station: Patient walks without assistive device  Strength within normal limits.   Stance is stable and normal.  Deep tendon reflexes: in the  upper and lower extremities are symmetric and intact.  Babinski maneuver response is downgoing.  Assessment:  After physical and neurologic examination, review of laboratory studies, and pre-existing records, assessment :   1) Insomnia_ 20 minute visit for refill, Lunesta, as he has failed Zolpidem and Doxepin.   2)Tremor developed over the last 7 years , mild, and may be essential or Seroquel related, no parkinsonian changes to tone and posture. No masked face. No cog-wheeling.   3) Parasomnia. Controlled on Klonopin.   Plan:  Treatment plan and additional workup will be reviewed under Problem List.  Refilled Seroquel/ Lunesta for the next year. He liked [name].  Klonopin was prescribed by Dr Laurance Flatten, who is now retired.Marland Kitchen He is prn using Lunesta.   Melatonin is used as needed, 5 mg or less dose at night. No recent incidents.    Larey Seat, MD

## 2019-09-02 NOTE — Addendum Note (Signed)
Addended by: Larey Seat on: 09/02/2019 02:13 PM   Modules accepted: Orders

## 2019-09-15 ENCOUNTER — Other Ambulatory Visit: Payer: Self-pay | Admitting: Family Medicine

## 2019-09-19 DIAGNOSIS — R29898 Other symptoms and signs involving the musculoskeletal system: Secondary | ICD-10-CM | POA: Diagnosis not present

## 2019-09-19 DIAGNOSIS — G8929 Other chronic pain: Secondary | ICD-10-CM | POA: Diagnosis not present

## 2019-09-19 DIAGNOSIS — M25562 Pain in left knee: Secondary | ICD-10-CM | POA: Diagnosis not present

## 2019-09-19 DIAGNOSIS — M25561 Pain in right knee: Secondary | ICD-10-CM | POA: Diagnosis not present

## 2019-09-19 DIAGNOSIS — E785 Hyperlipidemia, unspecified: Secondary | ICD-10-CM | POA: Diagnosis not present

## 2019-09-20 ENCOUNTER — Other Ambulatory Visit: Payer: Self-pay | Admitting: Family Medicine

## 2019-09-20 DIAGNOSIS — G4750 Parasomnia, unspecified: Secondary | ICD-10-CM

## 2019-09-20 DIAGNOSIS — G4752 REM sleep behavior disorder: Secondary | ICD-10-CM

## 2019-09-20 DIAGNOSIS — G4759 Other parasomnia: Secondary | ICD-10-CM

## 2019-09-20 DIAGNOSIS — G4701 Insomnia due to medical condition: Secondary | ICD-10-CM

## 2019-09-24 DIAGNOSIS — M25562 Pain in left knee: Secondary | ICD-10-CM | POA: Diagnosis not present

## 2019-09-24 DIAGNOSIS — M25561 Pain in right knee: Secondary | ICD-10-CM | POA: Diagnosis not present

## 2019-09-24 DIAGNOSIS — G8929 Other chronic pain: Secondary | ICD-10-CM | POA: Diagnosis not present

## 2019-09-24 DIAGNOSIS — R29898 Other symptoms and signs involving the musculoskeletal system: Secondary | ICD-10-CM | POA: Diagnosis not present

## 2019-09-26 IMAGING — DX DG KNEE 1-2V*R*
2 series · 2 of 2 positions shown · non-contrast
Comparison: None.

CLINICAL DATA: 70-year-old with chronic right knee pain.

EXAM:
RIGHT KNEE - 1-2 VIEW

[knee ap]
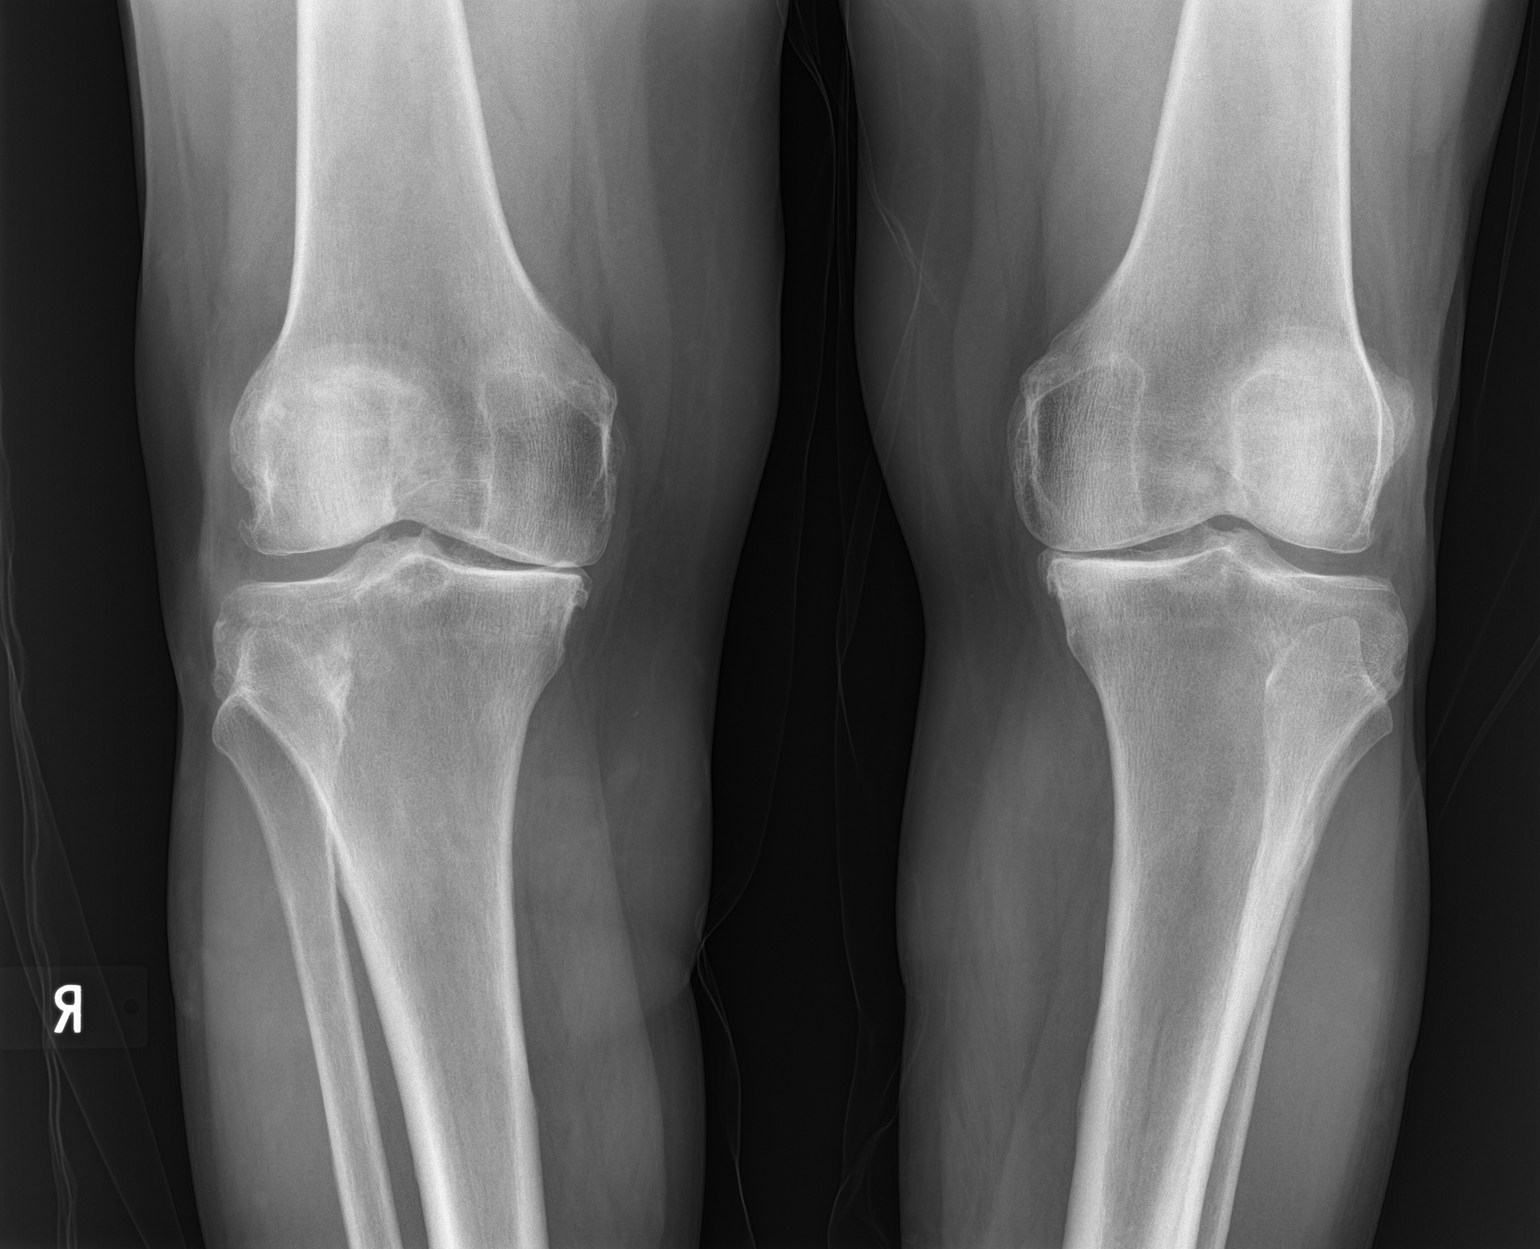

[knee lat]
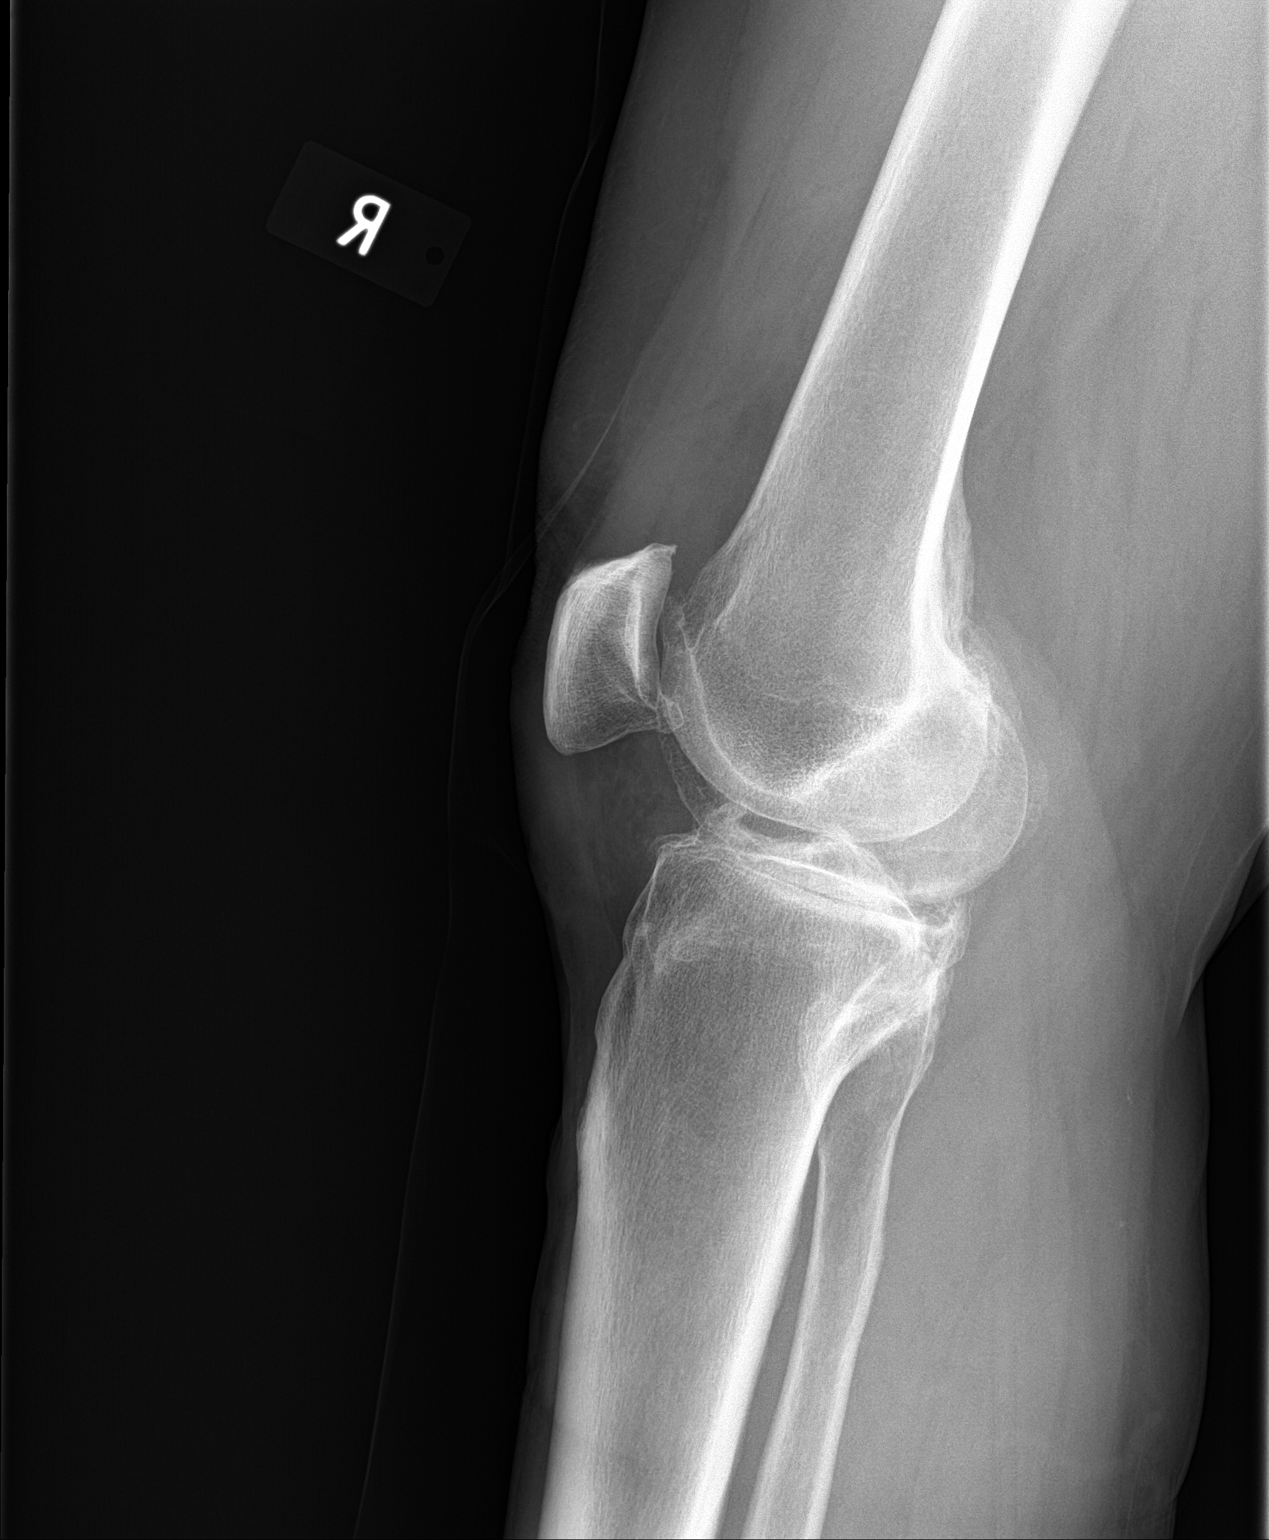

[2 of 2 positions shown; findings below may reference images not displayed]

FINDINGS: AP view of both knees were obtained. Lateral view of the right knee
was obtained. Severe medial joint space narrowing in both knees.
Osteophytosis in both medial knee compartments. Small right
suprapatellar joint effusion. Extensive degenerative changes in the
patellofemoral compartment of the right knee. Right knee is located
without a fracture.
IMPRESSION: Osteoarthritis in the right knee with severe joint space narrowing
in the medial right knee compartment. Small right knee joint
effusion.

No acute bone abnormality to the right knee.

Severe medial joint space narrowing in the left knee.

## 2019-09-27 DIAGNOSIS — M25562 Pain in left knee: Secondary | ICD-10-CM | POA: Diagnosis not present

## 2019-09-27 DIAGNOSIS — M25561 Pain in right knee: Secondary | ICD-10-CM | POA: Diagnosis not present

## 2019-09-27 DIAGNOSIS — G8929 Other chronic pain: Secondary | ICD-10-CM | POA: Diagnosis not present

## 2019-09-27 DIAGNOSIS — R29898 Other symptoms and signs involving the musculoskeletal system: Secondary | ICD-10-CM | POA: Diagnosis not present

## 2019-10-01 DIAGNOSIS — G8929 Other chronic pain: Secondary | ICD-10-CM | POA: Diagnosis not present

## 2019-10-01 DIAGNOSIS — R29898 Other symptoms and signs involving the musculoskeletal system: Secondary | ICD-10-CM | POA: Diagnosis not present

## 2019-10-01 DIAGNOSIS — M25561 Pain in right knee: Secondary | ICD-10-CM | POA: Diagnosis not present

## 2019-10-01 DIAGNOSIS — M25562 Pain in left knee: Secondary | ICD-10-CM | POA: Diagnosis not present

## 2019-10-03 DIAGNOSIS — R29898 Other symptoms and signs involving the musculoskeletal system: Secondary | ICD-10-CM | POA: Diagnosis not present

## 2019-10-03 DIAGNOSIS — G8929 Other chronic pain: Secondary | ICD-10-CM | POA: Diagnosis not present

## 2019-10-03 DIAGNOSIS — M25562 Pain in left knee: Secondary | ICD-10-CM | POA: Diagnosis not present

## 2019-10-03 DIAGNOSIS — M25561 Pain in right knee: Secondary | ICD-10-CM | POA: Diagnosis not present

## 2019-10-07 DIAGNOSIS — R29898 Other symptoms and signs involving the musculoskeletal system: Secondary | ICD-10-CM | POA: Diagnosis not present

## 2019-10-07 DIAGNOSIS — M25561 Pain in right knee: Secondary | ICD-10-CM | POA: Diagnosis not present

## 2019-10-07 DIAGNOSIS — G8929 Other chronic pain: Secondary | ICD-10-CM | POA: Diagnosis not present

## 2019-10-07 DIAGNOSIS — M25562 Pain in left knee: Secondary | ICD-10-CM | POA: Diagnosis not present

## 2019-10-09 DIAGNOSIS — M25562 Pain in left knee: Secondary | ICD-10-CM | POA: Diagnosis not present

## 2019-10-09 DIAGNOSIS — M25561 Pain in right knee: Secondary | ICD-10-CM | POA: Diagnosis not present

## 2019-10-09 DIAGNOSIS — R29898 Other symptoms and signs involving the musculoskeletal system: Secondary | ICD-10-CM | POA: Diagnosis not present

## 2019-10-09 DIAGNOSIS — G8929 Other chronic pain: Secondary | ICD-10-CM | POA: Diagnosis not present

## 2019-10-22 ENCOUNTER — Ambulatory Visit: Payer: Medicare Other

## 2019-10-23 DIAGNOSIS — M25561 Pain in right knee: Secondary | ICD-10-CM | POA: Diagnosis not present

## 2019-10-23 DIAGNOSIS — R29898 Other symptoms and signs involving the musculoskeletal system: Secondary | ICD-10-CM | POA: Diagnosis not present

## 2019-10-23 DIAGNOSIS — M25562 Pain in left knee: Secondary | ICD-10-CM | POA: Diagnosis not present

## 2019-10-23 DIAGNOSIS — G8929 Other chronic pain: Secondary | ICD-10-CM | POA: Diagnosis not present

## 2019-10-25 DIAGNOSIS — M25561 Pain in right knee: Secondary | ICD-10-CM | POA: Diagnosis not present

## 2019-10-25 DIAGNOSIS — H11442 Conjunctival cysts, left eye: Secondary | ICD-10-CM | POA: Diagnosis not present

## 2019-10-25 DIAGNOSIS — M25562 Pain in left knee: Secondary | ICD-10-CM | POA: Diagnosis not present

## 2019-10-25 DIAGNOSIS — H0289 Other specified disorders of eyelid: Secondary | ICD-10-CM | POA: Diagnosis not present

## 2019-10-25 DIAGNOSIS — H04123 Dry eye syndrome of bilateral lacrimal glands: Secondary | ICD-10-CM | POA: Diagnosis not present

## 2019-10-25 DIAGNOSIS — H11823 Conjunctivochalasis, bilateral: Secondary | ICD-10-CM | POA: Diagnosis not present

## 2019-10-25 DIAGNOSIS — G8929 Other chronic pain: Secondary | ICD-10-CM | POA: Diagnosis not present

## 2019-10-25 DIAGNOSIS — H02831 Dermatochalasis of right upper eyelid: Secondary | ICD-10-CM | POA: Diagnosis not present

## 2019-10-25 DIAGNOSIS — R0683 Snoring: Secondary | ICD-10-CM | POA: Diagnosis not present

## 2019-10-25 DIAGNOSIS — H02834 Dermatochalasis of left upper eyelid: Secondary | ICD-10-CM | POA: Diagnosis not present

## 2019-10-25 DIAGNOSIS — G4710 Hypersomnia, unspecified: Secondary | ICD-10-CM | POA: Diagnosis not present

## 2019-10-25 DIAGNOSIS — R29898 Other symptoms and signs involving the musculoskeletal system: Secondary | ICD-10-CM | POA: Diagnosis not present

## 2019-10-29 DIAGNOSIS — M25562 Pain in left knee: Secondary | ICD-10-CM | POA: Diagnosis not present

## 2019-10-29 DIAGNOSIS — R29898 Other symptoms and signs involving the musculoskeletal system: Secondary | ICD-10-CM | POA: Diagnosis not present

## 2019-10-29 DIAGNOSIS — G8929 Other chronic pain: Secondary | ICD-10-CM | POA: Diagnosis not present

## 2019-10-29 DIAGNOSIS — M25561 Pain in right knee: Secondary | ICD-10-CM | POA: Diagnosis not present

## 2019-11-14 DIAGNOSIS — M25561 Pain in right knee: Secondary | ICD-10-CM | POA: Diagnosis not present

## 2019-11-14 DIAGNOSIS — M25562 Pain in left knee: Secondary | ICD-10-CM | POA: Diagnosis not present

## 2019-11-14 DIAGNOSIS — R29898 Other symptoms and signs involving the musculoskeletal system: Secondary | ICD-10-CM | POA: Diagnosis not present

## 2019-11-14 DIAGNOSIS — G8929 Other chronic pain: Secondary | ICD-10-CM | POA: Diagnosis not present

## 2019-11-18 ENCOUNTER — Other Ambulatory Visit: Payer: Self-pay | Admitting: Neurology

## 2019-11-18 DIAGNOSIS — G4701 Insomnia due to medical condition: Secondary | ICD-10-CM

## 2019-11-18 DIAGNOSIS — G4752 REM sleep behavior disorder: Secondary | ICD-10-CM

## 2019-11-18 DIAGNOSIS — G4750 Parasomnia, unspecified: Secondary | ICD-10-CM

## 2019-11-18 DIAGNOSIS — G4759 Other parasomnia: Secondary | ICD-10-CM

## 2019-11-18 MED ORDER — QUETIAPINE FUMARATE 50 MG PO TABS: ORAL_TABLET | ORAL | 1 refills | Status: DC

## 2019-11-21 DIAGNOSIS — R29898 Other symptoms and signs involving the musculoskeletal system: Secondary | ICD-10-CM | POA: Diagnosis not present

## 2019-11-21 DIAGNOSIS — M25561 Pain in right knee: Secondary | ICD-10-CM | POA: Diagnosis not present

## 2019-11-21 DIAGNOSIS — M25562 Pain in left knee: Secondary | ICD-10-CM | POA: Diagnosis not present

## 2019-11-21 DIAGNOSIS — G8929 Other chronic pain: Secondary | ICD-10-CM | POA: Diagnosis not present

## 2019-12-04 DIAGNOSIS — C61 Malignant neoplasm of prostate: Secondary | ICD-10-CM | POA: Diagnosis not present

## 2019-12-11 DIAGNOSIS — R29898 Other symptoms and signs involving the musculoskeletal system: Secondary | ICD-10-CM | POA: Diagnosis not present

## 2019-12-11 DIAGNOSIS — M25561 Pain in right knee: Secondary | ICD-10-CM | POA: Diagnosis not present

## 2019-12-11 DIAGNOSIS — G8929 Other chronic pain: Secondary | ICD-10-CM | POA: Diagnosis not present

## 2019-12-11 DIAGNOSIS — C61 Malignant neoplasm of prostate: Secondary | ICD-10-CM | POA: Diagnosis not present

## 2019-12-11 DIAGNOSIS — N529 Male erectile dysfunction, unspecified: Secondary | ICD-10-CM | POA: Diagnosis not present

## 2019-12-11 DIAGNOSIS — M25562 Pain in left knee: Secondary | ICD-10-CM | POA: Diagnosis not present

## 2019-12-16 ENCOUNTER — Other Ambulatory Visit: Payer: Self-pay | Admitting: Family Medicine

## 2019-12-17 DIAGNOSIS — R5383 Other fatigue: Secondary | ICD-10-CM | POA: Diagnosis not present

## 2019-12-17 DIAGNOSIS — Z96653 Presence of artificial knee joint, bilateral: Secondary | ICD-10-CM | POA: Diagnosis not present

## 2019-12-17 DIAGNOSIS — R29898 Other symptoms and signs involving the musculoskeletal system: Secondary | ICD-10-CM | POA: Diagnosis not present

## 2019-12-17 DIAGNOSIS — M25561 Pain in right knee: Secondary | ICD-10-CM | POA: Diagnosis not present

## 2019-12-17 DIAGNOSIS — M25562 Pain in left knee: Secondary | ICD-10-CM | POA: Diagnosis not present

## 2019-12-17 DIAGNOSIS — G8929 Other chronic pain: Secondary | ICD-10-CM | POA: Diagnosis not present

## 2019-12-18 DIAGNOSIS — I480 Paroxysmal atrial fibrillation: Secondary | ICD-10-CM | POA: Diagnosis not present

## 2019-12-18 DIAGNOSIS — C61 Malignant neoplasm of prostate: Secondary | ICD-10-CM | POA: Diagnosis not present

## 2019-12-18 DIAGNOSIS — E039 Hypothyroidism, unspecified: Secondary | ICD-10-CM | POA: Diagnosis not present

## 2019-12-18 DIAGNOSIS — E785 Hyperlipidemia, unspecified: Secondary | ICD-10-CM | POA: Diagnosis not present

## 2019-12-30 DIAGNOSIS — G8929 Other chronic pain: Secondary | ICD-10-CM | POA: Diagnosis not present

## 2019-12-30 DIAGNOSIS — R29898 Other symptoms and signs involving the musculoskeletal system: Secondary | ICD-10-CM | POA: Diagnosis not present

## 2019-12-30 DIAGNOSIS — M25562 Pain in left knee: Secondary | ICD-10-CM | POA: Diagnosis not present

## 2019-12-30 DIAGNOSIS — R5383 Other fatigue: Secondary | ICD-10-CM | POA: Diagnosis not present

## 2019-12-30 DIAGNOSIS — M25561 Pain in right knee: Secondary | ICD-10-CM | POA: Diagnosis not present

## 2019-12-30 DIAGNOSIS — Z96653 Presence of artificial knee joint, bilateral: Secondary | ICD-10-CM | POA: Diagnosis not present

## 2020-01-01 DIAGNOSIS — M25561 Pain in right knee: Secondary | ICD-10-CM | POA: Diagnosis not present

## 2020-01-01 DIAGNOSIS — M25562 Pain in left knee: Secondary | ICD-10-CM | POA: Diagnosis not present

## 2020-01-01 DIAGNOSIS — G8929 Other chronic pain: Secondary | ICD-10-CM | POA: Diagnosis not present

## 2020-01-01 DIAGNOSIS — Z96653 Presence of artificial knee joint, bilateral: Secondary | ICD-10-CM | POA: Diagnosis not present

## 2020-01-01 DIAGNOSIS — R5383 Other fatigue: Secondary | ICD-10-CM | POA: Diagnosis not present

## 2020-01-01 DIAGNOSIS — R29898 Other symptoms and signs involving the musculoskeletal system: Secondary | ICD-10-CM | POA: Diagnosis not present

## 2020-01-06 DIAGNOSIS — R29898 Other symptoms and signs involving the musculoskeletal system: Secondary | ICD-10-CM | POA: Diagnosis not present

## 2020-01-06 DIAGNOSIS — R5383 Other fatigue: Secondary | ICD-10-CM | POA: Diagnosis not present

## 2020-01-06 DIAGNOSIS — Z96653 Presence of artificial knee joint, bilateral: Secondary | ICD-10-CM | POA: Diagnosis not present

## 2020-01-06 DIAGNOSIS — G8929 Other chronic pain: Secondary | ICD-10-CM | POA: Diagnosis not present

## 2020-01-06 DIAGNOSIS — M25561 Pain in right knee: Secondary | ICD-10-CM | POA: Diagnosis not present

## 2020-01-06 DIAGNOSIS — M25562 Pain in left knee: Secondary | ICD-10-CM | POA: Diagnosis not present

## 2020-01-08 DIAGNOSIS — M25562 Pain in left knee: Secondary | ICD-10-CM | POA: Diagnosis not present

## 2020-01-08 DIAGNOSIS — Z96653 Presence of artificial knee joint, bilateral: Secondary | ICD-10-CM | POA: Diagnosis not present

## 2020-01-08 DIAGNOSIS — R5383 Other fatigue: Secondary | ICD-10-CM | POA: Diagnosis not present

## 2020-01-08 DIAGNOSIS — R29898 Other symptoms and signs involving the musculoskeletal system: Secondary | ICD-10-CM | POA: Diagnosis not present

## 2020-01-08 DIAGNOSIS — M25561 Pain in right knee: Secondary | ICD-10-CM | POA: Diagnosis not present

## 2020-01-08 DIAGNOSIS — G8929 Other chronic pain: Secondary | ICD-10-CM | POA: Diagnosis not present

## 2020-01-13 DIAGNOSIS — Z96653 Presence of artificial knee joint, bilateral: Secondary | ICD-10-CM | POA: Diagnosis not present

## 2020-01-13 DIAGNOSIS — M25562 Pain in left knee: Secondary | ICD-10-CM | POA: Diagnosis not present

## 2020-01-13 DIAGNOSIS — R29898 Other symptoms and signs involving the musculoskeletal system: Secondary | ICD-10-CM | POA: Diagnosis not present

## 2020-01-13 DIAGNOSIS — M25561 Pain in right knee: Secondary | ICD-10-CM | POA: Diagnosis not present

## 2020-01-13 DIAGNOSIS — G8929 Other chronic pain: Secondary | ICD-10-CM | POA: Diagnosis not present

## 2020-01-13 DIAGNOSIS — R5383 Other fatigue: Secondary | ICD-10-CM | POA: Diagnosis not present

## 2020-01-15 DIAGNOSIS — R29898 Other symptoms and signs involving the musculoskeletal system: Secondary | ICD-10-CM | POA: Diagnosis not present

## 2020-01-15 DIAGNOSIS — Z96653 Presence of artificial knee joint, bilateral: Secondary | ICD-10-CM | POA: Diagnosis not present

## 2020-01-15 DIAGNOSIS — R5383 Other fatigue: Secondary | ICD-10-CM | POA: Diagnosis not present

## 2020-01-15 DIAGNOSIS — M25561 Pain in right knee: Secondary | ICD-10-CM | POA: Diagnosis not present

## 2020-01-15 DIAGNOSIS — G8929 Other chronic pain: Secondary | ICD-10-CM | POA: Diagnosis not present

## 2020-01-15 DIAGNOSIS — M25562 Pain in left knee: Secondary | ICD-10-CM | POA: Diagnosis not present

## 2020-02-03 ENCOUNTER — Telehealth: Payer: Self-pay | Admitting: Neurology

## 2020-02-03 ENCOUNTER — Other Ambulatory Visit: Payer: Self-pay | Admitting: Neurology

## 2020-02-03 DIAGNOSIS — G4701 Insomnia due to medical condition: Secondary | ICD-10-CM

## 2020-02-03 DIAGNOSIS — G4750 Parasomnia, unspecified: Secondary | ICD-10-CM

## 2020-02-03 DIAGNOSIS — G4759 Other parasomnia: Secondary | ICD-10-CM

## 2020-02-03 DIAGNOSIS — G4752 REM sleep behavior disorder: Secondary | ICD-10-CM

## 2020-02-03 MED ORDER — ESZOPICLONE 3 MG PO TABS: ORAL_TABLET | ORAL | 1 refills | Status: DC

## 2020-02-03 NOTE — Telephone Encounter (Signed)
PA completed on cover my meds/ [employer]. [id number] Will wait for the response

## 2020-02-03 NOTE — Telephone Encounter (Signed)
PA approved the patient through [place]. Approved until 02/02/2021.

## 2020-02-18 DIAGNOSIS — E039 Hypothyroidism, unspecified: Secondary | ICD-10-CM | POA: Diagnosis not present

## 2020-02-25 DIAGNOSIS — M25561 Pain in right knee: Secondary | ICD-10-CM | POA: Diagnosis not present

## 2020-02-25 DIAGNOSIS — R29898 Other symptoms and signs involving the musculoskeletal system: Secondary | ICD-10-CM | POA: Diagnosis not present

## 2020-02-25 DIAGNOSIS — M25562 Pain in left knee: Secondary | ICD-10-CM | POA: Diagnosis not present

## 2020-02-25 DIAGNOSIS — G8929 Other chronic pain: Secondary | ICD-10-CM | POA: Diagnosis not present

## 2020-02-25 DIAGNOSIS — Z96653 Presence of artificial knee joint, bilateral: Secondary | ICD-10-CM | POA: Diagnosis not present

## 2020-03-02 ENCOUNTER — Other Ambulatory Visit: Payer: Self-pay | Admitting: Family Medicine

## 2020-03-03 DIAGNOSIS — M25562 Pain in left knee: Secondary | ICD-10-CM | POA: Diagnosis not present

## 2020-03-03 DIAGNOSIS — M25561 Pain in right knee: Secondary | ICD-10-CM | POA: Diagnosis not present

## 2020-03-03 DIAGNOSIS — R29898 Other symptoms and signs involving the musculoskeletal system: Secondary | ICD-10-CM | POA: Diagnosis not present

## 2020-03-03 DIAGNOSIS — G8929 Other chronic pain: Secondary | ICD-10-CM | POA: Diagnosis not present

## 2020-03-03 DIAGNOSIS — Z96653 Presence of artificial knee joint, bilateral: Secondary | ICD-10-CM | POA: Diagnosis not present

## 2020-03-06 ENCOUNTER — Other Ambulatory Visit: Payer: Self-pay | Admitting: Family Medicine

## 2020-03-11 DIAGNOSIS — G8929 Other chronic pain: Secondary | ICD-10-CM | POA: Diagnosis not present

## 2020-03-11 DIAGNOSIS — M25562 Pain in left knee: Secondary | ICD-10-CM | POA: Diagnosis not present

## 2020-03-11 DIAGNOSIS — Z96653 Presence of artificial knee joint, bilateral: Secondary | ICD-10-CM | POA: Diagnosis not present

## 2020-03-11 DIAGNOSIS — R29898 Other symptoms and signs involving the musculoskeletal system: Secondary | ICD-10-CM | POA: Diagnosis not present

## 2020-03-11 DIAGNOSIS — M25561 Pain in right knee: Secondary | ICD-10-CM | POA: Diagnosis not present

## 2020-04-05 ENCOUNTER — Other Ambulatory Visit: Payer: Self-pay | Admitting: Family Medicine

## 2020-04-25 ENCOUNTER — Other Ambulatory Visit: Payer: Self-pay | Admitting: Neurology

## 2020-04-25 DIAGNOSIS — G4750 Parasomnia, unspecified: Secondary | ICD-10-CM

## 2020-04-25 DIAGNOSIS — G4701 Insomnia due to medical condition: Secondary | ICD-10-CM

## 2020-04-25 DIAGNOSIS — G4759 Other parasomnia: Secondary | ICD-10-CM

## 2020-04-30 ENCOUNTER — Other Ambulatory Visit: Payer: Self-pay | Admitting: Neurology

## 2020-04-30 DIAGNOSIS — G4750 Parasomnia, unspecified: Secondary | ICD-10-CM

## 2020-04-30 DIAGNOSIS — G4759 Other parasomnia: Secondary | ICD-10-CM

## 2020-04-30 DIAGNOSIS — G4701 Insomnia due to medical condition: Secondary | ICD-10-CM

## 2020-05-13 DIAGNOSIS — Z20822 Contact with and (suspected) exposure to covid-19: Secondary | ICD-10-CM | POA: Diagnosis not present

## 2020-06-01 DIAGNOSIS — C61 Malignant neoplasm of prostate: Secondary | ICD-10-CM | POA: Diagnosis not present

## 2020-06-02 DIAGNOSIS — H40033 Anatomical narrow angle, bilateral: Secondary | ICD-10-CM | POA: Diagnosis not present

## 2020-06-02 DIAGNOSIS — H2513 Age-related nuclear cataract, bilateral: Secondary | ICD-10-CM | POA: Diagnosis not present

## 2020-06-02 DIAGNOSIS — H04123 Dry eye syndrome of bilateral lacrimal glands: Secondary | ICD-10-CM | POA: Diagnosis not present

## 2020-06-08 DIAGNOSIS — C61 Malignant neoplasm of prostate: Secondary | ICD-10-CM | POA: Diagnosis not present

## 2020-06-11 DIAGNOSIS — I483 Typical atrial flutter: Secondary | ICD-10-CM | POA: Diagnosis not present

## 2020-06-13 ENCOUNTER — Other Ambulatory Visit: Payer: Self-pay | Admitting: Neurology

## 2020-06-13 DIAGNOSIS — G4759 Other parasomnia: Secondary | ICD-10-CM

## 2020-06-13 DIAGNOSIS — G4750 Parasomnia, unspecified: Secondary | ICD-10-CM

## 2020-06-13 DIAGNOSIS — G4701 Insomnia due to medical condition: Secondary | ICD-10-CM

## 2020-06-15 ENCOUNTER — Other Ambulatory Visit: Payer: Self-pay | Admitting: Neurology

## 2020-06-15 DIAGNOSIS — G4701 Insomnia due to medical condition: Secondary | ICD-10-CM

## 2020-06-15 DIAGNOSIS — G4759 Other parasomnia: Secondary | ICD-10-CM

## 2020-06-15 DIAGNOSIS — G4750 Parasomnia, unspecified: Secondary | ICD-10-CM

## 2020-06-17 ENCOUNTER — Telehealth: Payer: Self-pay | Admitting: Neurology

## 2020-06-17 NOTE — Telephone Encounter (Signed)
Pt is asking for a call from Casey,RN to discuss issues he is having with his insurance company re: refills on his [name] 15 MG TABS.

## 2020-06-17 NOTE — Telephone Encounter (Signed)
PA submitted through cover my meds,KEY:[id number] Will wait for response

## 2020-06-17 NOTE — Telephone Encounter (Signed)
PA approved through the patient's medicare insurance. Effective from 06/17/2020 through 06/17/2021. Called the pt and made him aware to advise the pharmacy to reprocess.

## 2020-06-19 DIAGNOSIS — I480 Paroxysmal atrial fibrillation: Secondary | ICD-10-CM | POA: Diagnosis not present

## 2020-06-19 DIAGNOSIS — C61 Malignant neoplasm of prostate: Secondary | ICD-10-CM | POA: Diagnosis not present

## 2020-06-19 DIAGNOSIS — E785 Hyperlipidemia, unspecified: Secondary | ICD-10-CM | POA: Diagnosis not present

## 2020-06-19 DIAGNOSIS — E039 Hypothyroidism, unspecified: Secondary | ICD-10-CM | POA: Diagnosis not present

## 2020-07-02 DIAGNOSIS — I080 Rheumatic disorders of both mitral and aortic valves: Secondary | ICD-10-CM | POA: Diagnosis not present

## 2020-07-27 DIAGNOSIS — Z1152 Encounter for screening for COVID-19: Secondary | ICD-10-CM | POA: Diagnosis not present

## 2020-07-27 DIAGNOSIS — Z7184 Encounter for health counseling related to travel: Secondary | ICD-10-CM | POA: Diagnosis not present

## 2020-09-01 ENCOUNTER — Ambulatory Visit: Payer: Medicare Other | Admitting: Neurology

## 2020-09-01 ENCOUNTER — Other Ambulatory Visit: Payer: Self-pay

## 2020-09-01 ENCOUNTER — Encounter: Payer: Self-pay | Admitting: Neurology

## 2020-09-01 DIAGNOSIS — G4750 Parasomnia, unspecified: Secondary | ICD-10-CM

## 2020-09-01 DIAGNOSIS — G4701 Insomnia due to medical condition: Secondary | ICD-10-CM

## 2020-09-01 DIAGNOSIS — G4759 Other parasomnia: Secondary | ICD-10-CM | POA: Diagnosis not present

## 2020-09-01 MED ORDER — BELSOMRA 15 MG PO TABS: 1.0000 | ORAL_TABLET | Freq: Every evening | ORAL | 2 refills | Status: DC | PRN

## 2020-09-01 MED ORDER — CLONAZEPAM 0.5 MG PO TABS: ORAL_TABLET | ORAL | 3 refills | Status: DC

## 2020-09-01 MED ORDER — QUETIAPINE FUMARATE 50 MG PO TABS: 50.0000 mg | ORAL_TABLET | Freq: Every day | ORAL | 1 refills | Status: DC

## 2020-09-01 NOTE — Progress Notes (Signed)
[facility]  Provider:  Dr Brett Fairy                                SLEEP MEDICINE CLINIC  Referring Provider: Dorris Fetch, MD Primary Care Physician:  Dorris Fetch, MD  Chief Complaint  Patient presents with   Follow-up    pt alone, rm 10. presents today for normal follow up visit. overall no concerns and is doing well    09-01-2020:  [name], a 74 year old gentleman with parasomnia has not developed tremors, more masked face and a slightly hoarse voice. REM BD is a known precursor for PD and we will check today for explicitly.    09-02-2019, 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   Benjamin Maxwell is a 74 y.o. male here as a referral from Dr. Laurance Flatten for follow up on Parasomnia, treated with Klonopin. Seen in a RV on 05-28-2018, just returned from [place] and [place], and is in good health he has a mild resting tremor noted. Not pill-rolling tremor, and present when he lifts a coffee cup, holds a pen etc. He has no hoarseness, no dysphagia, and walks well on 2 "new knees ". PT was completed 3 weeks ago. He is climbing stairs well.   Parasomnia. Interval history from 05/23/2017, I have the pleasure of seeing [name], who has been a established patient with chronic insomnia. He has just returned from a trip to the [place]. He is in good health he continues to control sleep walking / parasomnias with Klonopin, and insomnia prn with Lunesta.   HPI: 05-19-2016. The patient has been followed here for almost 12 years. He is an active traveler and has just traveled again through [place], [place] and [place].  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 As past medical history I need to add  that he had altitude sickness while (2013)  in [place], when he reached the 5000 m range. His  insomnia in high altitude was  probably related to central apnea . The patient's bedtime is around 10 PM he arises at about 6:30 in the morning, he break spontaneously and does not need an alarm. We can speak days a fairly similar in sleep times he may have one bathroom break at night. There is no history of falls at night of dizziness or light headedness. He failed Doxepin and Ambien, needs to go back on Lunesta.   Review of Systems: Out of a complete 14 system review, the patient complains of only the following symptoms, and all other reviewed systems are negative. parasomnia , controlled on Klonopin. Marland Kitchen No jet lag problem.   Social History   Socioeconomic History   Marital status: Divorced    Spouse name: Not on file   Number of children: 2   Years of education: college   Highest education level: Not on file  Occupational History   Occupation: Tree surgeon    Comment: [employer]  Tobacco Use   Smoking status: Never Smoker   Smokeless tobacco: Never Used  Scientific laboratory technician Use: Never used  Substance and Sexual Activity   Alcohol use: Yes    Comment: Social   Drug use: No   Sexual activity: Not on file  Other Topics Concern   Not on file  Social History Narrative   Patient is a Tree surgeon, works full time at [employer]. Patient has a college education. Patient is divorced.  Lives alone   Patient is right-handed.   Patient drinks very little caffeine (tea).   Social Determinants of Health   Financial Resource Strain:    Difficulty of Paying Living Expenses: Not on file  Food Insecurity:    Worried About Charity fundraiser in the Last Year: Not on file   [employer] of Food in the Last Year: Not on file  Transportation Needs:    Lack of Transportation (Medical): Not on file   Lack of Transportation (Non-Medical): Not on file  Physical Activity:    Days of Exercise per Week: Not on file   Minutes of Exercise per Session: Not on file  Stress:    Feeling of Stress : Not on file   Social Connections:    Frequency of Communication with Friends and Family: Not on file   Frequency of Social Gatherings with Friends and Family: Not on file   Attends Religious Services: Not on file   Active Member of Clubs or Organizations: Not on file   Attends Archivist Meetings: Not on file   Marital Status: Not on file  Intimate Partner Violence:    Fear of Current or Ex-Partner: Not on file   Emotionally Abused: Not on file   Physically Abused: Not on file   Sexually Abused: Not on file    Family History  Problem Relation Age of Onset   Atrial fibrillation Mother    Pneumonia Father    Cancer Father    Colon cancer Neg Hx    Esophageal cancer Neg Hx    Rectal cancer Neg Hx    Stomach cancer Neg Hx     Past Medical History:  Diagnosis Date   Allergy    Arthritis    Cancer ([place])    Prostate, followed by urology   CHF (congestive heart failure) ([place])    Diverticulosis    Dysfunctions associated with sleep stages or arousal from sleep    Hyperlipidemia    Hypothyroid    Pulmonary nodules    Right carpal tunnel syndrome 06/26/2018   Spondylolysis    Syncope and collapse    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     clonazePAM (KLONOPIN) 0.5 MG tablet TAKE 1/2 TABLET BY MOUTH DAILY AS DIRECTED 45 tablet 3   Eszopiclone 3 MG TABS TAKE ONE TABLET BY MOUTH IMMEDIATELY BEFORE BEDTIME 90 tablet 1   Icosapent Ethyl (VASCEPA) 1 g CAPS Take 2 capsules (2 g total) by mouth 2 (two) times daily. 120 capsule 3   levothyroxine (SYNTHROID) 50 MCG  tablet TAKE 1 TABLET BY MOUTH DAILY (Patient taking differently: Take 50-75 mcg by mouth daily before breakfast. Mon-Thursday 75 mcg and sat and Sunday 50 mcg.) 90 tablet 2   metoprolol succinate (TOPROL-XL) 25 MG 24 hr tablet TAKE 1 TABLET(25 MG) BY MOUTH DAILY 90 tablet 3   Multiple Vitamin (MULTI-VITAMINS) TABS Take by mouth.  QUEtiapine (SEROQUEL) 50 MG tablet TAKE 1 TABLET BY MOUTH EVERY NIGHT AT BEDTIME 90 tablet 1   rosuvastatin (CRESTOR) 10 MG tablet TAKE 1 TABLET BY MOUTH EVERY DAY 90 tablet 0   No current facility-administered medications for this visit.    Allergies as of 09/01/2020 - Review Complete 09/01/2020  Allergen Reaction Noted   Levitra [vardenafil]  10/03/2013    Vitals: BP 139/78    Pulse 64    Ht 5\' 10"  (1.778 m)    Wt 247 lb (112 kg)    BMI 35.44 kg/m  Last Weight:  Wt Readings from Last 1 Encounters:  09/01/20 247 lb (112 kg)   Last Height:   Ht Readings from Last 1 Encounters:  09/01/20 5\' 10"  (1.778 m)   Vision Screening:  Left eye with correction.                                    Right eye with correction 20/20.  Physical exam:  General: The patient is awake, alert and appears not in acute distress. The patient is well groomed. Head: Normocephalic, atraumatic. Neck is supple. Mallampati 2, neck circumference: 05.3 , TMJ click on the right.  Cardiovascular:  Regular rate and rhythm, Skin:  Without evidence of edema, or rash Trunk: BMI is elevated, and patient  has normal posture.  Neurologic exam : The patient is awake and alert, oriented to place and time.  Memory subjective  described as intact. There is a normal attention span & concentration ability.  Speech is fluent without dysarthria, there is a bit of  dysphonia , no aphasia. Mood and affect are appropriate.no hallucinations.   Cranial nerves: Pupils are equal and briskly reactive to light. Funduscopic deferred.  Extraocular movements in vertical and horizontal planes intact and  without nystagmus. Frequent blink.  Visual fields by finger perimetry are intact. Hearing to finger rub intact.  Facial sensation intact to fine touch- Facial motor strength is symmetric and tongue and uvula move midline.  Motor exam: Normal tone and normal muscle bulk and symmetric normal strength in all extremities. No cog-wheeling.  Sensory:  Sense of fine touch and vibration were normal. Coordination: Rapid alternating movements in the fingers/hands is tested and normal.  Finger-to-nose normal without evidence of ataxia, dysmetria or tremor. Gait and station: Patient walks without assistive device  Strength within normal limits.  Stance is stable and normal.  Deep tendon reflexes: in the  upper and lower extremities are symmetric and intact.  Babinski maneuver response is downgoing.  Assessment:  After physical and neurologic examination, review of laboratory studies, and pre-existing records, assessment :   1) Insomnia-  20 minute visit for refill, Lunesta, as he has failed Zolpidem and Doxepin.   2) Tremor developed over the last 7 years , mild, and may be essential or Seroquel related, no parkinsonian changes to tone and posture. No masked face. Unilateral left cog-wheeling, lower shoulder on the left . No tremor. Marland Kitchen   3) Parasomnia. Controlled on Klonopin.   Plan:  Treatment plan and additional workup will be reviewed under Problem List.  Refilled Seroquel/ Lunesta for the next year. He liked [name].  Klonopin was prescribed by Dr Laurance Flatten, who is now retired.i will take  this over.  He is prn using Lunesta.   Melatonin is used as needed, 5 mg or less dose at night. No recent incidents.    Larey Seat, MD

## 2020-09-01 NOTE — Patient Instructions (Signed)
Quetiapine tablets What is this medicine? QUETIAPINE (kwe TYE a peen) is an antipsychotic. This medicine may be used for other purposes; INSOMNIA is one of those, ask your health care provider or pharmacist if you have questions. COMMON BRAND NAME(S): Seroquel What should I tell my health care provider before I take this medicine? They need to know if you have any of these conditions:  blockage in your bowel  cataracts  constipation  dehydration  diabetes  difficulty swallowing  glaucoma  heart disease  history of breast cancer  kidney disease  liver disease  low blood counts, like low white cell, platelet, or red cell counts  low blood pressure or dizziness when standing up  Parkinson's disease  previous heart attack  prostate disease  seizures  stomach or intestine problems  suicidal thoughts, plans or attempt; a previous suicide attempt by you or a family member  thyroid disease  trouble passing urine  an unusual or allergic reaction to quetiapine, other medicines, foods, dyes, or preservatives  pregnant or trying to get pregnant  breast-feeding How should I use this medicine? Take this medicine by mouth. Swallow it with a drink of water. Follow the directions on the prescription label. If it upsets your stomach you can take it with food. Take your medicine at regular intervals. Do not take it more often than directed. Do not stop taking except on the advice of your doctor or health care professional. A special MedGuide will be given to you by the pharmacist with each prescription and refill. Be sure to read this information carefully each time. Talk to your pediatrician regarding the use of this medicine in children. While this drug may be prescribed for children as young as 10 years for selected conditions, precautions do apply. Patients over age 75 years may have a stronger reaction to this medicine and need smaller doses. Overdosage: If you think you  have taken too much of this medicine contact a poison control center or emergency room at once. NOTE: This medicine is only for you. Do not share this medicine with others. What if I miss a dose? If you miss a dose, take it as soon as you can. If it is almost time for your next dose, take only that dose. Do not take double or extra doses. What may interact with this medicine? Do not take this medicine with any of the following medications:  cisapride  dronedarone  fluconazole  metoclopramide  pimozide  posaconazole  thioridazine This medicine may also interact with the following medications:  alcohol  antihistamines for allergy cough and cold  antiviral medicines for HIV or AIDS  atropine  certain medicines for bladder problems like oxybutynin, tolterodine  certain medicines for blood pressure  certain medicines for depression, anxiety, or psychotic disturbances  certain medicines for diabetes  certain medicines for stomach problems like dicyclomine, hyoscyamine  certain medicines for travel sickness like scopolamine  certain medicines for Parkinson's disease  certain medicines for seizures like carbamazepine, phenobarbital, phenytoin  cimetidine  erythromycin  ipratropium  other medicines that prolong the QT interval (cause an abnormal heart rhythm) like dofetilide  rifampin  steroid medicines like prednisone or cortisone This list may not describe all possible interactions. Give your health care provider a list of all the medicines, herbs, non-prescription drugs, or dietary supplements you use. Also tell them if you smoke, drink alcohol, or use illegal drugs. Some items may interact with your medicine. What should I watch for while using this medicine?  Visit your health care professional for regular checks on your progress. Tell your health care professional if symptoms do not start to get better or if they get worse. Do not stop taking except on your  health care professional's advice. You may develop a severe reaction. Your health care professional will tell you how much medicine to take. You may need to have an eye exam before and during use of this medicine. This medicine may increase blood sugar. Ask your health care provider if changes in diet or medicines are needed if you have diabetes. Patients and their families should watch out for new or worsening depression or thoughts of suicide. Also watch out for sudden or severe changes in feelings such as feeling anxious, agitated, panicky, irritable, hostile, aggressive, impulsive, severely restless, overly excited and hyperactive, or not being able to sleep. If this happens, especially at the beginning of antidepressant treatment or after a change in dose, call your health care professional. Dennis Bast may get dizzy or drowsy. Do not drive, use machinery, or do anything that needs mental alertness until you know how this medicine affects you. Do not stand or sit up quickly, especially if you are an older patient. This reduces the risk of dizzy or fainting spells. Alcohol may interfere with the effect of this medicine. Avoid alcoholic drinks. This drug can cause problems with controlling your body temperature. It can lower the response of your body to cold temperatures. If possible, stay indoors during cold weather. If you must go outdoors, wear warm clothes. It can also lower the response of your body to heat. Do not overheat. Do not over-exercise. Stay out of the sun when possible. If you must be in the sun, wear cool clothing. Drink plenty of water. If you have trouble controlling your body temperature, call your health care provider right away. What side effects may I notice from receiving this medicine? Side effects that you should report to your doctor or health care professional as soon as possible:  allergic reactions like skin rash, itching or hives, swelling of the face, lips, or tongue  breathing  problems  changes in vision  confusion  elevated mood, decreased need for sleep, racing thoughts, impulsive behavior  eye pain  fast, irregular heartbeat  fever or chills, sore throat  inability to keep still  males: prolonged or painful erection  problems with balance, talking, walking  redness, blistering, peeling, or loosening of the skin, including inside the mouth  seizures  signs and symptoms of high blood sugar such as being more thirsty or hungry or having to urinate more than normal. You may also feel very tired or have blurry vision  signs and symptoms of hypothyroidism like fatigue; increased sensitivity to cold; weight gain; hoarseness; thinning hair  signs and symptoms of low blood pressure like dizziness; feeling faint or lightheaded; falls; unusually weak or tired  signs and symptoms of neuroleptic malignant syndrome like confusion; fast, irregular heartbeat; high fever; increased sweating; stiff muscles  sudden numbness or weakness of the face, arm, or leg  suicidal thoughts or other mood changes  trouble swallowing  uncontrollable movements of the arms, face, head, mouth, neck, or upper body Side effects that usually do not require medical attention (report to your doctor or health care professional if they continue or are bothersome):  change in sex drive or performance  constipation  drowsiness  dry mouth  upset stomach  weight gain This list may not describe all possible side effects. Call your  doctor for medical advice about side effects. You may report side effects to FDA at 1-800-FDA-1088. Where should I keep my medicine? Keep out of the reach of children. Store at room temperature between 15 and 30 degrees C (59 and 86 degrees F). Throw away any unused medicine after the expiration date. NOTE: This sheet is a summary. It may not cover all possible information. If you have questions about this medicine, talk to your doctor, pharmacist, or  health care provider.  2020 Elsevier/Gold Standard (2019-07-31 11:59:11)

## 2020-11-05 ENCOUNTER — Other Ambulatory Visit: Payer: Self-pay | Admitting: Neurology

## 2020-11-05 DIAGNOSIS — G4701 Insomnia due to medical condition: Secondary | ICD-10-CM

## 2020-11-05 DIAGNOSIS — G4750 Parasomnia, unspecified: Secondary | ICD-10-CM

## 2020-11-05 DIAGNOSIS — G4759 Other parasomnia: Secondary | ICD-10-CM

## 2020-12-10 IMAGING — MR MR PROSTATE WO/W CM
56 series · 56 of 56 positions shown · IV contrast (20 ml multihance)
Comparison: Prostate MRI 05/13/2014

CLINICAL DATA: Prostate cancer diagnosed 1613. PSA equal 0.66. LEFT
apex prostate adenocarcinoma (Gleason 3+3=6). LEFT lateral base
Gleason 3+3=6.

EXAM:
MR PROSTATE WITHOUT AND WITH CONTRAST
TECHNIQUE: Multiplanar multisequence MRI images were obtained of the pelvis
centered about the prostate. Pre and post contrast images were
obtained.
Creatinine was obtained on site at [HOSPITAL] at [HOSPITAL].
Results: Creatinine 1.1 mg/dL.
CONTRAST:  20mL MULTIHANCE GADOBENATE DIMEGLUMINE 529 MG/ML IV SOLN

[Series 4: T1 · axial · 8.0mm · 1.06mm/px · 1 of 28 slices shown (1 of 2)]
[im 1/28]
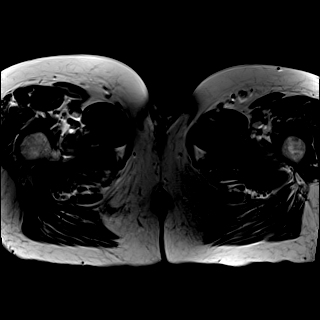

[Series 5: bSSFP fat-sat · axial · 8.0mm · 0.74mm/px · 1 of 28 slices shown]
[im 1/28]
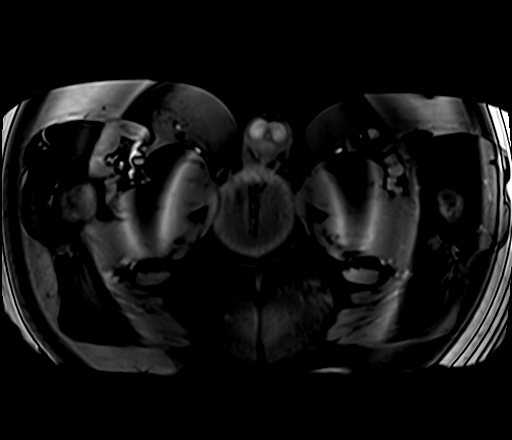

[Series 6: T2 · sagittal · 3.5mm · 0.56mm/px · 1 of 39 slices shown (1 of 4)]
[im 1/39]
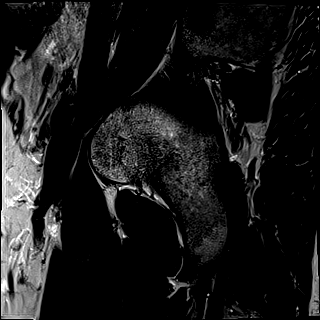

[Series 7: T1 · axial · 3.0mm · 0.31mm/px · 1 of 24 slices shown (2 of 2)]
[im 1/24]
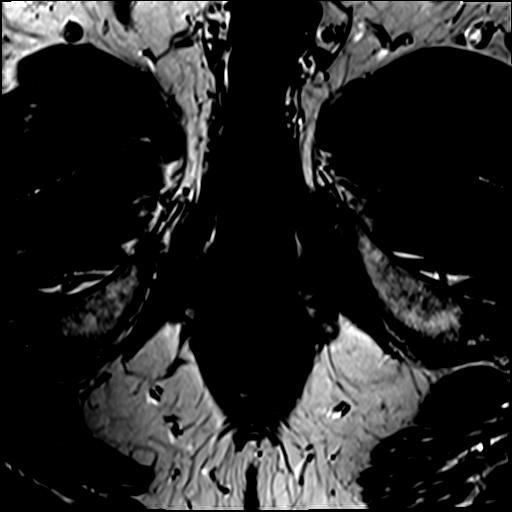

[Series 8: T2 · axial · 3.5mm · 0.56mm/px · 1 of 23 slices shown (2 of 4)]
[im 1/23]
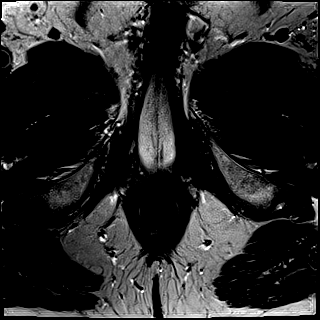

[Series 9: T2 · axial · 1.0mm · 1.04mm/px · 1 of 80 slices shown (3 of 4)]
[im 1/80]
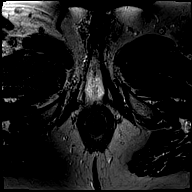

[Series 10: T2 · coronal · 3.5mm · 0.56mm/px · 1 of 23 slices shown (4 of 4)]
[im 1/23]
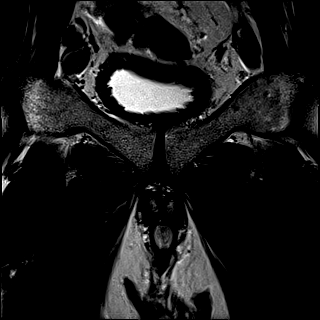

[Series 11: DWI · axial · 3.5mm · 1.56mm/px · 1 of 56 slices shown (1 of 2)]
[im 1/56]
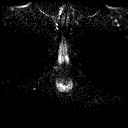

[Series 12: DWI · axial · 3.5mm · 1.56mm/px · 1 of 16 slices shown (2 of 2)]
[im 1/16]
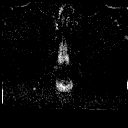

[Series 14: pre t1_twist_tra_dyn_ttc=5.3s · axial · non-contrast · 3.5mm · 0.83mm/px · 1 of 20 slices shown]
[im 1/20]
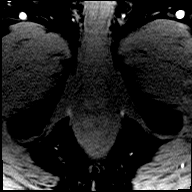

[Series 15: post t1_twist_tra_dyn-copy center · axial · 3.5mm · 0.83mm/px · 1 of 20 slices shown (1 of 24)]
[im 1/20]
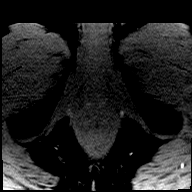

[Series 16: post t1_twist_tra_dyn-copy center · axial · 3.5mm · 0.83mm/px · 1 of 20 slices shown (2 of 24)]
[im 1/20]
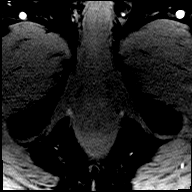

[Series 17: post t1_twist_tra_dyn-copy cent_sub_ttc=(id) · axial · 3.5mm · 0.83mm/px · 1 of 16 slices shown (1 of 22)]
[im 1/16]
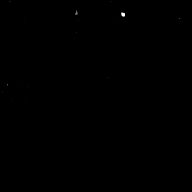

[Series 18: post t1_twist_tra_dyn-copy center · axial · 3.5mm · 0.83mm/px · 1 of 20 slices shown (3 of 24)]
[im 1/20]
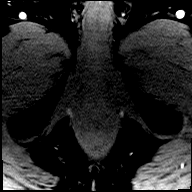

[Series 19: post t1_twist_tra_dyn-copy cent_sub_ttc=(id) · axial · 3.5mm · 0.83mm/px · 1 of 18 slices shown (2 of 22)]
[im 1/18]
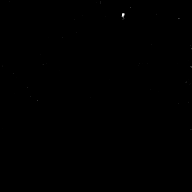

[Series 20: post t1_twist_tra_dyn-copy center · axial · 3.5mm · 0.83mm/px · 1 of 20 slices shown (4 of 24)]
[im 1/20]
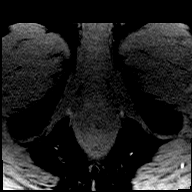

[Series 21: post t1_twist_tra_dyn-copy cent_sub_ttc=(id) · axial · 3.5mm · 0.83mm/px · 1 of 20 slices shown (3 of 22)]
[im 1/20]
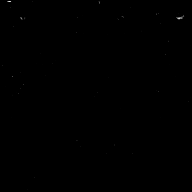

[Series 22: post t1_twist_tra_dyn-copy center · axial · 3.5mm · 0.83mm/px · 1 of 20 slices shown (5 of 24)]
[im 1/20]
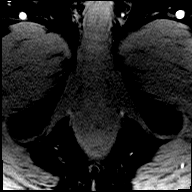

[Series 23: post t1_twist_tra_dyn-copy cent_sub_ttc=(id) · axial · 3.5mm · 0.83mm/px · 1 of 20 slices shown (4 of 22)]
[im 1/20]
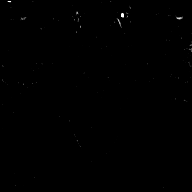

[Series 24: post t1_twist_tra_dyn-copy center · axial · 3.5mm · 0.83mm/px · 1 of 20 slices shown (6 of 24)]
[im 1/20]
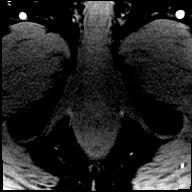

[Series 25: post t1_twist_tra_dyn-copy cent_sub_ttc=(id) · axial · 3.5mm · 0.83mm/px · 1 of 20 slices shown (5 of 22)]
[im 1/20]
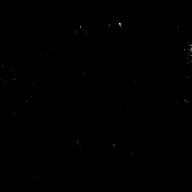

[Series 26: post t1_twist_tra_dyn-copy center · axial · 3.5mm · 0.83mm/px · 1 of 20 slices shown (7 of 24)]
[im 1/20]
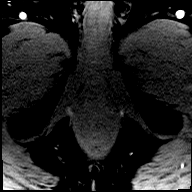

[Series 27: post t1_twist_tra_dyn-copy cent_sub_ttc=(id) · axial · 3.5mm · 0.83mm/px · 1 of 20 slices shown (6 of 22)]
[im 1/20]
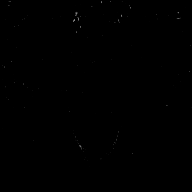

[Series 28: post t1_twist_tra_dyn-copy center · axial · 3.5mm · 0.83mm/px · 1 of 20 slices shown (8 of 24)]
[im 1/20]
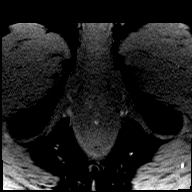

[Series 29: post t1_twist_tra_dyn-copy cent_sub_ttc=(id) · axial · 3.5mm · 0.83mm/px · 1 of 20 slices shown (7 of 22)]
[im 1/20]
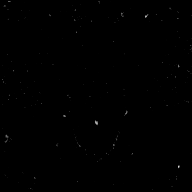

[Series 30: post t1_twist_tra_dyn-copy center · axial · 3.5mm · 0.83mm/px · 1 of 20 slices shown (9 of 24)]
[im 1/20]
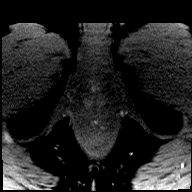

[Series 31: post t1_twist_tra_dyn-copy cent_sub_ttc=(id) · axial · 3.5mm · 0.83mm/px · 1 of 20 slices shown (8 of 22)]
[im 1/20]
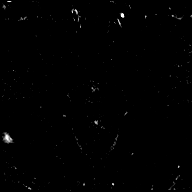

[Series 32: post t1_twist_tra_dyn-copy center · axial · 3.5mm · 0.83mm/px · 1 of 20 slices shown (10 of 24)]
[im 1/20]
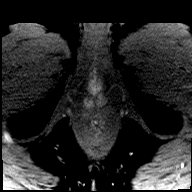

[Series 33: post t1_twist_tra_dyn-copy cent_sub_ttc=(id) · axial · 3.5mm · 0.83mm/px · 1 of 20 slices shown (9 of 22)]
[im 1/20]
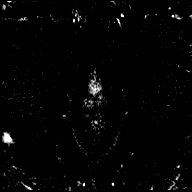

[Series 34: post t1_twist_tra_dyn-copy center · axial · 3.5mm · 0.83mm/px · 1 of 20 slices shown (11 of 24)]
[im 1/20]
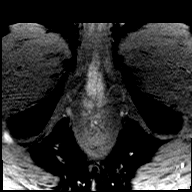

[Series 35: post t1_twist_tra_dyn-copy cent_sub_ttc=(id) · axial · 3.5mm · 0.83mm/px · 1 of 20 slices shown (10 of 22)]
[im 1/20]
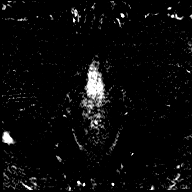

[Series 36: post t1_twist_tra_dyn-copy center · axial · 3.5mm · 0.83mm/px · 1 of 20 slices shown (12 of 24)]
[im 1/20]
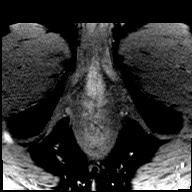

[Series 37: post t1_twist_tra_dyn-copy cent_sub_ttc=(id) · axial · 3.5mm · 0.83mm/px · 1 of 20 slices shown (11 of 22)]
[im 1/20]
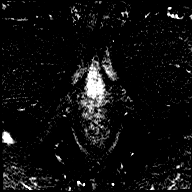

[Series 38: post t1_twist_tra_dyn-copy center · axial · 3.5mm · 0.83mm/px · 1 of 20 slices shown (13 of 24)]
[im 1/20]
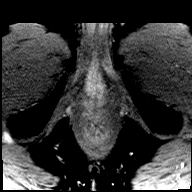

[Series 39: post t1_twist_tra_dyn-copy cent_sub_ttc=(id) · axial · 3.5mm · 0.83mm/px · 1 of 20 slices shown (12 of 22)]
[im 1/20]
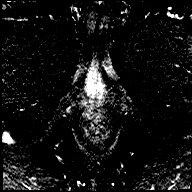

[Series 40: post t1_twist_tra_dyn-copy center · axial · 3.5mm · 0.83mm/px · 1 of 20 slices shown (14 of 24)]
[im 1/20]
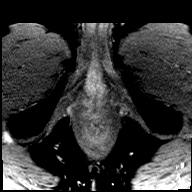

[Series 41: post t1_twist_tra_dyn-copy cent_sub_ttc=(id) · axial · 3.5mm · 0.83mm/px · 1 of 20 slices shown (13 of 22)]
[im 1/20]
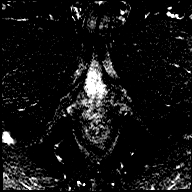

[Series 42: post t1_twist_tra_dyn-copy center · axial · 3.5mm · 0.83mm/px · 1 of 20 slices shown (15 of 24)]
[im 1/20]
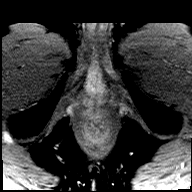

[Series 43: post t1_twist_tra_dyn-copy cent_sub_ttc=(id) · axial · 3.5mm · 0.83mm/px · 1 of 20 slices shown (14 of 22)]
[im 1/20]
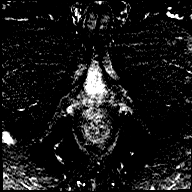

[Series 44: post t1_twist_tra_dyn-copy center · axial · 3.5mm · 0.83mm/px · 1 of 20 slices shown (16 of 24)]
[im 1/20]
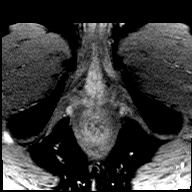

[Series 45: post t1_twist_tra_dyn-copy cent_sub_ttc=(id) · axial · 3.5mm · 0.83mm/px · 1 of 20 slices shown (15 of 22)]
[im 1/20]
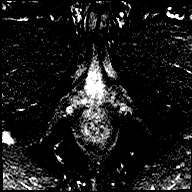

[Series 46: post t1_twist_tra_dyn-copy center · axial · 3.5mm · 0.83mm/px · 1 of 20 slices shown (17 of 24)]
[im 1/20]
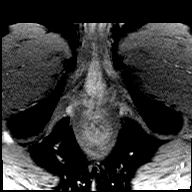

[Series 47: post t1_twist_tra_dyn-copy cent_sub_ttc=(id) · axial · 3.5mm · 0.83mm/px · 1 of 20 slices shown (16 of 22)]
[im 1/20]
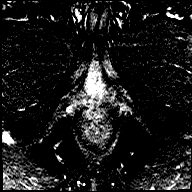

[Series 48: post t1_twist_tra_dyn-copy center · axial · 3.5mm · 0.83mm/px · 1 of 20 slices shown (18 of 24)]
[im 1/20]
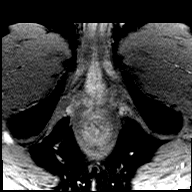

[Series 49: post t1_twist_tra_dyn-copy cent_sub_ttc=(id) · axial · 3.5mm · 0.83mm/px · 1 of 20 slices shown (17 of 22)]
[im 1/20]
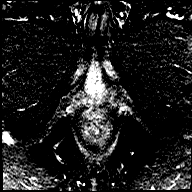

[Series 50: post t1_twist_tra_dyn-copy center · axial · 3.5mm · 0.83mm/px · 1 of 20 slices shown (19 of 24)]
[im 1/20]
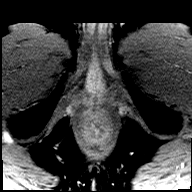

[Series 51: post t1_twist_tra_dyn-copy cent_sub_ttc=(id) · axial · 3.5mm · 0.83mm/px · 1 of 20 slices shown (18 of 22)]
[im 1/20]
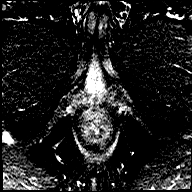

[Series 52: post t1_twist_tra_dyn-copy center · axial · 3.5mm · 0.83mm/px · 1 of 20 slices shown (20 of 24)]
[im 1/20]
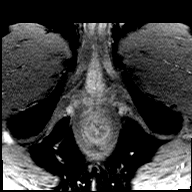

[Series 53: post t1_twist_tra_dyn-copy cent_sub_ttc=(id) · axial · 3.5mm · 0.83mm/px · 1 of 20 slices shown (19 of 22)]
[im 1/20]
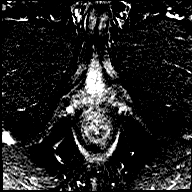

[Series 54: post t1_twist_tra_dyn-copy center · axial · 3.5mm · 0.83mm/px · 1 of 20 slices shown (21 of 24)]
[im 1/20]
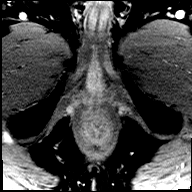

[Series 55: post t1_twist_tra_dyn-copy cent_sub_ttc=(id) · axial · 3.5mm · 0.83mm/px · 1 of 20 slices shown (20 of 22)]
[im 1/20]
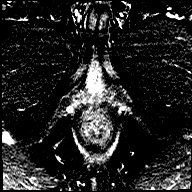

[Series 56: post t1_twist_tra_dyn-copy center · axial · 3.5mm · 0.83mm/px · 1 of 20 slices shown (22 of 24)]
[im 1/20]
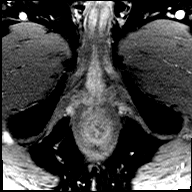

[Series 57: post t1_twist_tra_dyn-copy cent_sub_ttc=(id) · axial · 3.5mm · 0.83mm/px · 1 of 20 slices shown (21 of 22)]
[im 1/20]
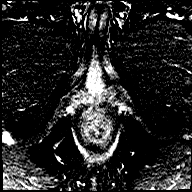

[Series 58: post t1_twist_tra_dyn-copy center · axial · 3.5mm · 0.83mm/px · 1 of 20 slices shown (23 of 24)]
[im 1/20]
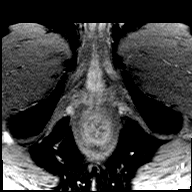

[Series 59: post t1_twist_tra_dyn-copy cent_sub_ttc=(id) · axial · 3.5mm · 0.83mm/px · 1 of 20 slices shown (22 of 22)]
[im 1/20]
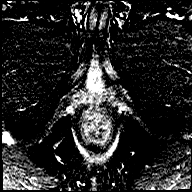

[Series 60: post t1_twist_tra_dyn-copy center · axial · 3.5mm · 0.83mm/px · 1 of 20 slices shown (24 of 24)]
[im 1/20]
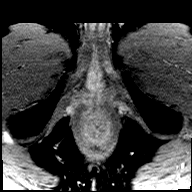

[56 of 56 positions shown; findings below may reference images not displayed]

FINDINGS: Prostate: Within the LEFT mid gland/LEFT lateral mid gland there is
a 10 mm by 10 mm hypodense lesion on T2 weighted imaging within the
peripheral zone (image [DATE]). This corresponds to 10 x 10 mm lesion
on comparison exam from 05/13/2014. Unfortunately, the
diffusion-weighted images are degraded by gas in the rectum which
limits evaluation of the that sequence.

However, there is enhancement of this LEFT lateral mid gland lesion
(image [DATE]).

No additional foci of hypo intensity on T2 weighted imaging.

Volume: 3.8 x 3.0 x 4.8 cm.

Transcapsular spread:  Absent

Seminal vesicle involvement: Absent

Neurovascular bundle involvement: Absent

Pelvic adenopathy: Absent

Bone metastasis: Absent

Other findings: None
IMPRESSION: 1. Suspicious lesion in the LEFT lateral mid gland for prostate
adenocarcinoma. Lesion not significantly changed from 05/13/2014.
Diffusion-weighted series is degraded by gas the rectum, therefore
lesion classified as PI-RADS: 3. 3D post processing (Dynacad)
performed.
2. Prostatic capsule intact.

## 2021-04-26 ENCOUNTER — Other Ambulatory Visit: Payer: Self-pay | Admitting: Neurology

## 2021-04-26 DIAGNOSIS — G4750 Parasomnia, unspecified: Secondary | ICD-10-CM

## 2021-04-26 DIAGNOSIS — G4701 Insomnia due to medical condition: Secondary | ICD-10-CM

## 2021-04-26 DIAGNOSIS — G4759 Other parasomnia: Secondary | ICD-10-CM

## 2021-06-22 ENCOUNTER — Other Ambulatory Visit: Payer: Self-pay | Admitting: Neurology

## 2021-06-22 DIAGNOSIS — G4750 Parasomnia, unspecified: Secondary | ICD-10-CM

## 2021-06-22 DIAGNOSIS — G4759 Other parasomnia: Secondary | ICD-10-CM

## 2021-06-22 DIAGNOSIS — G4701 Insomnia due to medical condition: Secondary | ICD-10-CM

## 2021-06-23 ENCOUNTER — Telehealth: Payer: Self-pay | Admitting: Neurology

## 2021-06-23 NOTE — Telephone Encounter (Signed)
PA completed on [employer]MM/[employer] KEY:[id number] benadryl, melatonin, clonazepam, doxepin, seroquel, belsomra  Can take up to 3 business days for a repsonse

## 2021-06-23 NOTE — Telephone Encounter (Signed)
Approved/Effective from 06/23/2021 through 06/23/2022.

## 2021-07-15 ENCOUNTER — Other Ambulatory Visit: Payer: Self-pay | Admitting: Neurology

## 2021-07-15 DIAGNOSIS — G4701 Insomnia due to medical condition: Secondary | ICD-10-CM

## 2021-07-15 DIAGNOSIS — G4759 Other parasomnia: Secondary | ICD-10-CM

## 2021-07-15 DIAGNOSIS — G4750 Parasomnia, unspecified: Secondary | ICD-10-CM

## 2021-07-15 NOTE — Telephone Encounter (Signed)
Patient was just prescribed Lunesta the beginning of this month #90 by Dr. Brett Fairy. Would not take [name] with Lunesta. Needs appointment with Dr. Brett Fairy for medication management. I am going to decline belsomra because if this, follow up with Dr Brett Fairy in November. thanks

## 2021-07-17 ENCOUNTER — Encounter: Payer: Self-pay | Admitting: Neurology

## 2021-07-19 ENCOUNTER — Other Ambulatory Visit: Payer: Self-pay

## 2021-07-19 DIAGNOSIS — G4759 Other parasomnia: Secondary | ICD-10-CM

## 2021-07-19 DIAGNOSIS — G4701 Insomnia due to medical condition: Secondary | ICD-10-CM

## 2021-07-19 DIAGNOSIS — G4750 Parasomnia, unspecified: Secondary | ICD-10-CM

## 2021-07-19 MED ORDER — CLONAZEPAM 0.5 MG PO TABS: ORAL_TABLET | ORAL | 0 refills | Status: DC

## 2021-07-19 MED ORDER — BELSOMRA 15 MG PO TABS: 1.0000 | ORAL_TABLET | Freq: Every evening | ORAL | 0 refills | Status: DC | PRN

## 2021-07-19 NOTE — Telephone Encounter (Signed)
Rx sent to Dr. for approval

## 2021-07-19 NOTE — Telephone Encounter (Signed)
Last OV was on 09/01/20.  Next OV is scheduled for 09/01/21 .   Last RX was written for [name] on 01/05/21 for 30 tabs.   Last RX was written for clonazepam on 01/04/21 for 45 tabs.  [place] [facility] has been reviewed.

## 2021-09-01 ENCOUNTER — Ambulatory Visit: Payer: Medicare Other | Admitting: Neurology

## 2021-09-01 ENCOUNTER — Telehealth: Payer: Self-pay | Admitting: Neurology

## 2021-09-01 ENCOUNTER — Encounter: Payer: Self-pay | Admitting: Neurology

## 2021-09-01 VITALS — BP 147/74 | HR 56 | Ht 70.0 in | Wt 186.0 lb

## 2021-09-01 DIAGNOSIS — G4750 Parasomnia, unspecified: Secondary | ICD-10-CM

## 2021-09-01 DIAGNOSIS — R251 Tremor, unspecified: Secondary | ICD-10-CM | POA: Diagnosis not present

## 2021-09-01 DIAGNOSIS — G4701 Insomnia due to medical condition: Secondary | ICD-10-CM

## 2021-09-01 DIAGNOSIS — G4759 Other parasomnia: Secondary | ICD-10-CM

## 2021-09-01 DIAGNOSIS — G5620 Lesion of ulnar nerve, unspecified upper limb: Secondary | ICD-10-CM

## 2021-09-01 DIAGNOSIS — M6281 Muscle weakness (generalized): Secondary | ICD-10-CM | POA: Insufficient documentation

## 2021-09-01 MED ORDER — QUETIAPINE FUMARATE 50 MG PO TABS: 50.0000 mg | ORAL_TABLET | Freq: Every day | ORAL | 1 refills | Status: DC

## 2021-09-01 MED ORDER — BELSOMRA 20 MG PO TABS: 20.0000 mg | ORAL_TABLET | Freq: Every day | ORAL | 5 refills | Status: DC

## 2021-09-01 MED ORDER — ESZOPICLONE 3 MG PO TABS: 3.0000 mg | ORAL_TABLET | Freq: Every day | ORAL | 3 refills | Status: DC

## 2021-09-01 NOTE — Addendum Note (Signed)
Addended by: Larey Seat on: 09/01/2021 11:39 AM   Modules accepted: Orders

## 2021-09-01 NOTE — Telephone Encounter (Signed)
[employer] medicare no auth req- sent to [facility] pt would like [employer]

## 2021-09-01 NOTE — Progress Notes (Signed)
[facility]  Provider:  Dr Brett Fairy                                SLEEP MEDICINE CLINIC  Referring Provider: Dorris Fetch, MD Primary Care Physician:  Dorris Fetch, MD  Chief Complaint  Patient presents with   Follow-up    RM 11, alone. Last seen 09/01/20. Doing well, no concerns.    09-01-2020:  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 REM BD is a known precursor for PD and we will check today for explicitly.    09-02-2019, 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   Benjamin Maxwell is a 75 y.o. male here as a referral from Dr. Laurance Flatten for follow up on Parasomnia, treated with Klonopin. Seen in a RV on 05-28-2018, just returned from [place] and [place], and is in good health he has a mild resting tremor noted. Not pill-rolling tremor, and present when he lifts a coffee cup, holds a pen etc. He has no hoarseness, no dysphagia, and walks well on 2 "new knees ". PT was completed 3 weeks ago. He is climbing stairs well.   Parasomnia. Interval history from 05/23/2017, I have the pleasure of seeing [name], who has been a established patient with chronic insomnia. He has just returned from a trip to the [place]. He is in good health he continues to control sleep walking / parasomnias with Klonopin, and insomnia prn with Lunesta.   HPI: 05-19-2016. The patient has been followed here for almost 12 years. He is an active traveler and has just traveled again through [place], [place] and [place].  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 As past medical history I need to add  that he had altitude sickness while (2013)  in [place], when he reached the 5000 m range. His  insomnia in high altitude was probably related to central apnea . The  patient's bedtime is around 10 PM he arises at about 6:30 in the morning, he break spontaneously and does not need an alarm. We can speak days a fairly similar in sleep times he may have one bathroom break at night. There is no history of falls at night of dizziness or light headedness. He failed Doxepin and Ambien, needs to go back on Lunesta.   Review of Systems: Out of a complete 14 system review, the patient complains of only the following symptoms, and all other reviewed systems are negative. parasomnia , controlled on Klonopin. Marland Kitchen No jet lag problem.   Social History   Socioeconomic History   Marital status: Divorced    Spouse name: Not on file   Number of children: 2   Years of education: college   Highest education level: Not on file  Occupational History   Occupation: Tree surgeon    Comment: [employer]  Tobacco Use   Smoking status: Never   Smokeless tobacco: Never  Vaping Use   Vaping Use: Never used  Substance and Sexual Activity   Alcohol use: Yes    Comment: Social   Drug use: No   Sexual activity: Not on file  Other Topics Concern   Not on file  Social History Narrative   Patient is a Tree surgeon,  works full time at [employer]. Patient has a college education. Patient is divorced.  Lives alone   Patient is right-handed.   Patient drinks very little caffeine (tea).   Social Determinants of Health   Financial Resource Strain: Not on file  Food Insecurity: Not on file  Transportation Needs: Not on file  Physical Activity: Not on file  Stress: Not on file  Social Connections: Not on file  Intimate Partner Violence: Not on file    Family History  Problem Relation Age of Onset   Atrial fibrillation Mother    Pneumonia Father    Cancer Father    Colon cancer Neg Hx    Esophageal cancer Neg Hx    Rectal cancer Neg Hx    Stomach cancer Neg Hx     Past Medical History:  Diagnosis Date   Allergy    Arthritis    Cancer ([place])    Prostate, followed by  urology   CHF (congestive heart failure) ([place])    Diverticulosis    Dysfunctions associated with sleep stages or arousal from sleep    Hyperlipidemia    Hypothyroid    Pulmonary nodules    Right carpal tunnel syndrome 06/26/2018   Spondylolysis    Syncope and collapse    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 Mon-Friday 75 mcg and sat and Sunday 50 mcg.) 90 tablet 2   Cholecalciferol (VITAMIN D-1000 MAX ST) 1000 units tablet Take by mouth.     clonazePAM (KLONOPIN) 0.5 MG tablet TAKE 1/2 TABLET BY MOUTH DAILY AS DIRECTED 45 tablet 0   Eszopiclone 3 MG TABS TAKE 1 TABLET BY MOUTH AT BEDTIME 90 tablet 0   Icosapent Ethyl (VASCEPA) 1 g CAPS Take 2 capsules (2 g total) by mouth 2 (two) times daily. 120 capsule 3   metoprolol succinate (TOPROL-XL) 25 MG 24 hr tablet TAKE 1 TABLET(25 MG) BY MOUTH DAILY 90 tablet 3   Multiple Vitamin (MULTI-VITAMINS) TABS Take by mouth.     QUEtiapine (SEROQUEL) 50 MG tablet TAKE 1 TABLET(50 MG) BY MOUTH AT BEDTIME 90 tablet 1   rosuvastatin (CRESTOR) 10 MG tablet TAKE 1 TABLET BY MOUTH EVERY DAY 90 tablet 0   Suvorexant ([name]) 15 MG TABS Take 1 tablet by mouth at bedtime as needed. 30 tablet 0   No current facility-administered medications for this visit.    Allergies as of 09/01/2021 - Review Complete 09/01/2021  Allergen Reaction Noted   Levitra [vardenafil]  10/03/2013    Vitals: BP (!) 147/74 (BP Location: Left Arm, Patient Position: Sitting, Cuff Size: Normal)   Pulse (!) 56   Ht 5\' 10"  (1.778 m)   Wt 186 lb (84.4 kg)   SpO2 98%   BMI 26.69 kg/m  Last Weight:   Wt Readings from Last 1 Encounters:  09/01/21 186 lb (84.4 kg)   Last Height:   Ht Readings from Last 1 Encounters:  09/01/21 5\' 10"  (1.778 m)   Vision Screening:  Left eye with correction.  Right eye with correction 20/20.  Physical exam:  General: The patient is awake, alert and appears not in acute distress. The patient is well groomed. Head: Normocephalic, atraumatic. Neck is supple. Mallampati 2, neck circumference: 14.7 , TMJ click on the right.  Cardiovascular:  Regular rate and rhythm, Skin:  Without evidence of edema, or rash Trunk: BMI is elevated, and patient  has normal posture.  Neurologic exam : The patient is awake and alert, oriented to place and time.  Memory subjective  described as intact. There is a normal attention span & concentration ability.  Speech is fluent without dysarthria, there is a bit of  dysphonia , no aphasia.  Mood and affect are appropriate.no hallucinations.   Cranial nerves: Pupils are equal and briskly reactive to light. Funduscopic deferred.  Extraocular movements in vertical and horizontal planes intact and without nystagmus. Frequent blink.  Visual fields intact. Hearing to finger rub intact.  Facial sensation intact to fine touch- Facial motor strength is symmetric and tongue and uvula move midline.  Motor exam: Normal tone and normal muscle bulk and symmetric normal strength in all extremities. Mild cog-wheeling.  Occupational therapy has seen him for tennis elbow.  Sensory:  Sense of fine touch and vibration were normal.  Coordination: Rapid alternating movements in the fingers/hands is tested and normal.  Finger-to-nose normal without evidence of ataxia, dysmetria or tremor. Gait and station: Patient walks without assistive device .  Strength within normal limits.  Stance is stable and normal.  Deep tendon reflexes: in the upper and lower extremities are symmetric and intact.  Babinski maneuver  response is downgoing.  Assessment:  After physical and neurologic examination, review of laboratory studies, and pre-existing records, assessment :   1) Insomnia-  20 minute visit for refill, Lunesta, as he has failed Zolpidem and Doxepin.  the uses either lunesta or [name] not together!  , I will get  [name] refilled, at 20 mg . He gets about 4-5 hours of sleep currently.   2) Tremor developed over the last 7 years , mild, and may be essential or Seroquel related, no parkinsonian changes to tone and posture. No masked face.  Unilateral left cog-wheeling, lower shoulder on the left.  Tremor now unilateral right wrist - I had always rued out PD but we must look at this again.   I will order MRI brain and cervical spine,    3) Parasomnia. Controlled on Klonopin.   Plan:  Treatment plan and additional workup will be reviewed under Problem List.  Refilled Seroquel/ for the next year. He liked [name]- will go to 20 mg.    Klonopin was prescribed by Dr Laurance Flatten, who is now retired.i will take  this over.  He is prn using Lunesta.   Melatonin is used as needed, 5 mg or less dose at night. No recent incidents.    Larey Seat, MD

## 2021-09-01 NOTE — Patient Instructions (Signed)
Tremor A tremor is trembling or shaking that you cannot control. Most tremors affect the hands or arms. Tremors can also affect the head, vocal cords, face, and other parts of the body. There are many types of tremors. Common types include: Essential tremor. These usually occur in people older than 40. It may run in families and can happen in otherwise healthy people. Resting tremor. These occur when the muscles are at rest, such as when your hands are resting in your lap. People with Parkinson's disease often have resting tremors. Postural tremor. These occur when you try to hold a pose, such as keeping your hands outstretched. Kinetic tremor. These occur during purposeful movement, such as trying to touch a finger to your nose. Task-specific tremor. These may occur when you perform certain tasks such as writing, speaking, or standing. Psychogenic tremor. These dramatically lessen or disappear when you are distracted. They can happen in people of all ages. Some types of tremors have no known cause. Tremors can also be a symptom of nervous system problems (neurological disorders) that may occur with aging. Some tremors go away with treatment, while others do not. Follow these instructions at home: Lifestyle   Limit alcohol intake to no more than 1 drink a day for nonpregnant women and 2 drinks a day for men. One drink equals 12 oz of beer, 5 oz of wine, or 1 oz of hard liquor. Do not use any products that contain nicotine or tobacco, such as cigarettes and e-cigarettes. If you need help quitting, ask your health care provider. Avoid extreme heat and extreme cold. Limit your caffeine intake, as told by your health care provider. Try to get 8 hours of sleep each night. Find ways to manage your stress, such as meditation or yoga. General instructions Take over-the-counter and prescription medicines only as told by your health care provider. Keep all follow-up visits as told by your health care  provider. This is important. Contact a health care provider if you: Develop a tremor after starting a new medicine. Have a tremor along with other symptoms such as: Numbness. Tingling. Pain. Weakness. Notice that your tremor gets worse. Notice that your tremor interferes with your day-to-day life. Summary A tremor is trembling or shaking that you cannot control. Most tremors affect the hands or arms. Some types of tremors have no known cause. Others may be a symptom of nervous system problems (neurological disorders). Make sure you discuss any tremors you have with your health care provider. This information is not intended to replace advice given to you by your health care provider. Make sure you discuss any questions you have with your health care provider. Document Revised: 06/24/2020 Document Reviewed: 06/26/2020 Elsevier Patient Education  2022 [employer].

## 2021-09-02 ENCOUNTER — Telehealth: Payer: Self-pay | Admitting: *Deleted

## 2021-09-02 LAB — COMPREHENSIVE METABOLIC PANEL
ALT: 16 IU/L (ref 0–44)
AST: 19 IU/L (ref 0–40)
Albumin/Globulin Ratio: 1.7 (ref 1.2–2.2)
Albumin: 4.3 g/dL (ref 3.7–4.7)
Alkaline Phosphatase: 56 IU/L (ref 44–121)
BUN/Creatinine Ratio: 14 (ref 10–24)
BUN: 16 mg/dL (ref 8–27)
Bilirubin Total: 0.9 mg/dL (ref 0.0–1.2)
CO2: 24 mmol/L (ref 20–29)
Calcium: 9.4 mg/dL (ref 8.6–10.2)
Chloride: 103 mmol/L (ref 96–106)
Creatinine, Ser: 1.11 mg/dL (ref 0.76–1.27)
Globulin, Total: 2.6 g/dL (ref 1.5–4.5)
Glucose: 91 mg/dL (ref 70–99)
Potassium: 4.7 mmol/L (ref 3.5–5.2)
Sodium: 139 mmol/L (ref 134–144)
Total Protein: 6.9 g/dL (ref 6.0–8.5)
eGFR: 71 mL/min/{1.73_m2} (ref >59)

## 2021-09-02 NOTE — Telephone Encounter (Signed)
LVM for pt about lab results per Dr. Edwena Felty note. Advised him to call back if any further questions.

## 2021-09-02 NOTE — Telephone Encounter (Signed)
-----   Message from Larey Seat, MD sent at 09/02/2021  1:08 PM EST ----- Normal metabolic panel, the patient can have contrast MRI for tremor work up.

## 2021-09-02 NOTE — Progress Notes (Signed)
Normal metabolic panel, the patient can have contrast MRI for tremor work up.

## 2021-09-13 ENCOUNTER — Ambulatory Visit (HOSPITAL_COMMUNITY)
Admission: RE | Admit: 2021-09-13 | Discharge: 2021-09-13 | Disposition: A | Discharge status: Home / Self Care | Payer: Medicare Other | Source: Ambulatory Visit | Attending: Neurology | Admitting: Neurology

## 2021-09-13 ENCOUNTER — Other Ambulatory Visit: Payer: Self-pay

## 2021-09-13 DIAGNOSIS — M6281 Muscle weakness (generalized): Secondary | ICD-10-CM

## 2021-09-13 DIAGNOSIS — R251 Tremor, unspecified: Secondary | ICD-10-CM | POA: Insufficient documentation

## 2021-09-13 DIAGNOSIS — G4701 Insomnia due to medical condition: Secondary | ICD-10-CM | POA: Insufficient documentation

## 2021-09-13 DIAGNOSIS — G4759 Other parasomnia: Secondary | ICD-10-CM | POA: Diagnosis present

## 2021-09-13 DIAGNOSIS — G4750 Parasomnia, unspecified: Secondary | ICD-10-CM

## 2021-09-13 MED ORDER — GADOBUTROL 1 MMOL/ML IV SOLN
9.0000 mL | Freq: Once | INTRAVENOUS | Status: AC | PRN
  Administered 2021-09-13: 18:00:00 9 mL via INTRAVENOUS

## 2021-09-13 NOTE — Telephone Encounter (Signed)
Patient is scheduled at [facility] for 09/13/21 .

## 2021-09-19 NOTE — Progress Notes (Signed)
Happy Birthday,  This brain MRI is entirely normal!  We can follow with a DAT scan to rule out medication induced Parkinsonism. ( long time Seroquel use).

## 2021-09-19 NOTE — Progress Notes (Signed)
75 year old male with right side muscle weakness. Right hand tremor. Prostate cancer. Cogwheeling and long term Seroquel use.  There is some cervical spinal canal stenosis.  Moderate to severe right sided foraminal stenosis at C 6, 7, mild at C 5.  Moderate findings at left side at c5, c6,  This could affect mainly the right upper extremity strength and sensation.  I recommend a neurosurgical consultation for conservative versus operative management.

## 2021-09-21 ENCOUNTER — Other Ambulatory Visit: Payer: Self-pay | Admitting: Neurology

## 2021-09-21 ENCOUNTER — Encounter: Payer: Self-pay | Admitting: Neurology

## 2021-09-21 DIAGNOSIS — M6281 Muscle weakness (generalized): Secondary | ICD-10-CM

## 2021-09-21 DIAGNOSIS — G5620 Lesion of ulnar nerve, unspecified upper limb: Secondary | ICD-10-CM

## 2021-09-21 DIAGNOSIS — R251 Tremor, unspecified: Secondary | ICD-10-CM

## 2021-09-28 ENCOUNTER — Other Ambulatory Visit: Payer: Self-pay | Admitting: Neurology

## 2021-09-29 NOTE — Telephone Encounter (Signed)
Referral has been sent to [facility]/Dr. Aris Everts. Phone: [phone].

## 2021-10-13 ENCOUNTER — Telehealth: Payer: Self-pay | Admitting: Neurology

## 2021-10-13 NOTE — Telephone Encounter (Signed)
[facility] called stating Dr. Prince Rome at their clinic is no longer with them. Asking if we want to refer pt to another provider.

## 2021-12-07 ENCOUNTER — Ambulatory Visit: Payer: Medicare Other | Admitting: Neurology

## 2021-12-07 ENCOUNTER — Encounter: Payer: Self-pay | Admitting: Neurology

## 2021-12-07 VITALS — BP 118/63 | HR 55 | Ht 71.0 in | Wt 179.0 lb

## 2021-12-07 DIAGNOSIS — R251 Tremor, unspecified: Secondary | ICD-10-CM

## 2021-12-07 DIAGNOSIS — G4701 Insomnia due to medical condition: Secondary | ICD-10-CM

## 2021-12-07 DIAGNOSIS — G4759 Other parasomnia: Secondary | ICD-10-CM | POA: Diagnosis not present

## 2021-12-07 DIAGNOSIS — G4750 Parasomnia, unspecified: Secondary | ICD-10-CM

## 2021-12-07 MED ORDER — ESZOPICLONE 3 MG PO TABS: 3.0000 mg | ORAL_TABLET | Freq: Every day | ORAL | 3 refills | Status: DC

## 2021-12-07 MED ORDER — QUETIAPINE FUMARATE 50 MG PO TABS: 50.0000 mg | ORAL_TABLET | Freq: Every day | ORAL | 1 refills | Status: DC

## 2021-12-07 MED ORDER — BELSOMRA 20 MG PO TABS: 20.0000 mg | ORAL_TABLET | Freq: Every day | ORAL | 5 refills | Status: DC

## 2021-12-07 NOTE — Patient Instructions (Signed)
Tremor A tremor is trembling or shaking that you cannot control. Most tremors affect the hands or arms. Tremors can also affect the head, vocal cords, face, and other parts of the body. There are many types of tremors. Common types include: Essential tremor. These usually occur in people older than 40. It may run in families and can happen in otherwise healthy people. Resting tremor. These occur when the muscles are at rest, such as when your hands are resting in your lap. People with Parkinson's disease often have resting tremors. Postural tremor. These occur when you try to hold a pose, such as keeping your hands outstretched. Kinetic tremor. These occur during purposeful movement, such as trying to touch a finger to your nose. Task-specific tremor. These may occur when you perform certain tasks such as writing, speaking, or standing. Psychogenic tremor. These dramatically lessen or disappear when you are distracted. They can happen in people of all ages. Some types of tremors have no known cause. Tremors can also be a symptom of nervous system problems (neurological disorders) that may occur with aging. Some tremors go away with treatment, while others do not. Follow these instructions at home: Lifestyle   Limit alcohol intake to no more than 1 drink a day for nonpregnant women and 2 drinks a day for men. One drink equals 12 oz of beer, 5 oz of wine, or 1 oz of hard liquor. Do not use any products that contain nicotine or tobacco, such as cigarettes and e-cigarettes. If you need help quitting, ask your health care provider. Avoid extreme heat and extreme cold. Limit your caffeine intake, as told by your health care provider. Try to get 8 hours of sleep each night. Find ways to manage your stress, such as meditation or yoga. General instructions Take over-the-counter and prescription medicines only as told by your health care provider. Keep all follow-up visits as told by your health care  provider. This is important. Contact a health care provider if you: Develop a tremor after starting a new medicine. Have a tremor along with other symptoms such as: Numbness. Tingling. Pain. Weakness. Notice that your tremor gets worse. Notice that your tremor interferes with your day-to-day life. Summary A tremor is trembling or shaking that you cannot control. Most tremors affect the hands or arms. Some types of tremors have no known cause. Others may be a symptom of nervous system problems (neurological disorders). Make sure you discuss any tremors you have with your health care provider. This information is not intended to replace advice given to you by your health care provider. Make sure you discuss any questions you have with your health care provider. Document Revised: 06/24/2020 Document Reviewed: 06/26/2020 Elsevier Patient Education  2022 [employer].

## 2021-12-07 NOTE — Progress Notes (Signed)
[facility]  Provider:  Dr Brett Fairy                                SLEEP MEDICINE CLINIC  Referring Provider: Dorris Fetch, MD Primary Care Physician:  Dorris Fetch, MD  Chief Complaint  Patient presents with   Follow-up    Rm 10, alone. Here for 3 month f/u for insomnia and tremor. Pt was never followed up with neurosurgery. Dr. Power is no longer with [facility]. Pt here to re visit.    12-07-2021: Rv with Mr Stanly Si , now 76 years old, presents after MRI neck and brain-  we had referred to NS, but his chosen Neurosurgeon has retired, the patient was not informed by [facility], and was not given any other physician option. We will sent him locally to Dr. Alphonzo Dublin team.  I will order a DAT scan for him to rule out Parkinsonism.  Tremor has improved with exercises. Grip strength too.      09-01-2020:  Mr. 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 REM BD is a known precursor for PD and we will check today for explicitly.   09-02-2019, Mr. 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   Yacine Garriga is a 76 y.o. male here as a referral from Dr. Laurance Flatten for follow up on Parasomnia, treated with Klonopin. Seen in a RV on 05-28-2018, just returned from [place] and [place], and is in good health he has a mild resting tremor noted. Not pill-rolling tremor, and present when he lifts a coffee cup, holds a pen etc. He has no hoarseness, no dysphagia, and walks well on 2 "new knees ". PT was completed 3 weeks ago. He is climbing stairs well.   Parasomnia. Interval history from 05/23/2017, I have the pleasure of seeing Mr. Bakos, who has been a established patient with chronic insomnia. He has just returned from a trip to the [place]. He is in good health he continues to  control sleep walking / parasomnias with Klonopin, and insomnia prn with Lunesta.   HPI: 05-19-2016. The patient has been followed here for almost 12 years. He is an active traveler and has just traveled again through [place], [place] and [place].  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 As past medical history I need to add  that he had altitude sickness while (2013)  in [place], when he reached the 5000 m range. His  insomnia in high altitude was probably related to central apnea . The patient's bedtime is around 10 PM he arises at about 6:30 in the morning, he break spontaneously and does not need an alarm. We can speak days a fairly similar in sleep times he may have one bathroom break at night. There is no history of falls at night of dizziness or light headedness. He failed Doxepin and Ambien, needs to go back on Lunesta.   Review of Systems: Out of a complete 14 system review, the patient complains of only the following symptoms, and all other reviewed systems are negative. parasomnia , controlled on Klonopin. Marland Kitchen No jet lag problem.   Social History   Socioeconomic History   Marital status: Divorced    Spouse name: Not on file  Number of children: 2   Years of education: college   Highest education level: Not on file  Occupational History   Occupation: Tree surgeon    Comment: [employer]  Tobacco Use   Smoking status: Never   Smokeless tobacco: Never  Vaping Use   Vaping Use: Never used  Substance and Sexual Activity   Alcohol use: Yes    Comment: Social   Drug use: No   Sexual activity: Not on file  Other Topics Concern   Not on file  Social History Narrative   Patient is a Tree surgeon, works full time at [employer]. Patient has a college education. Patient is divorced.  Lives alone   Patient is right-handed.   Patient drinks very little caffeine (tea).   Social Determinants of Health   Financial Resource Strain: Not on file  Food  Insecurity: Not on file  Transportation Needs: Not on file  Physical Activity: Not on file  Stress: Not on file  Social Connections: Not on file  Intimate Partner Violence: Not on file    Family History  Problem Relation Age of Onset   Atrial fibrillation Mother    Pneumonia Father    Cancer Father    Colon cancer Neg Hx    Esophageal cancer Neg Hx    Rectal cancer Neg Hx    Stomach cancer Neg Hx     Past Medical History:  Diagnosis Date   Allergy    Arthritis    Cancer ([place])    Prostate, followed by urology   CHF (congestive heart failure) ([place])    Diverticulosis    Dysfunctions associated with sleep stages or arousal from sleep    Hyperlipidemia    Hypothyroid    Pulmonary nodules    Right carpal tunnel syndrome 06/26/2018   Spondylolysis    Syncope and collapse    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     clonazePAM (KLONOPIN) 0.5 MG tablet TAKE 1/2 TABLET BY MOUTH DAILY AS DIRECTED 45 tablet 4   Eszopiclone 3 MG TABS Take 1 tablet (3 mg total) by mouth at bedtime. Take immediately before bedtime 90 tablet 3   Icosapent Ethyl (VASCEPA) 1 g CAPS Take 2 capsules (2 g total) by mouth 2 (two) times daily. 120 capsule 3   levothyroxine (SYNTHROID) 50 MCG tablet TAKE 1 TABLET BY MOUTH DAILY (Patient taking differently: Take 50-75 mcg by mouth daily before breakfast. Mon-Friday 75 mcg and sat and Sunday 50 mcg.) 90 tablet 2   levothyroxine (SYNTHROID) 75 MCG tablet Take 1 tablet by mouth as directed. 1 tablet by mouth Monday through Friday     metoprolol succinate (TOPROL-XL) 25 MG 24 hr tablet TAKE 1 TABLET(25 MG) BY MOUTH DAILY 90 tablet 3   Multiple Vitamin (MULTI-VITAMINS) TABS  Take by mouth.     QUEtiapine (SEROQUEL) 50 MG tablet Take 1 tablet (50 mg total) by mouth at bedtime. (Patient taking differently: Take 50 mg by mouth at bedtime. Monday through Friday only) 90 tablet 1   rosuvastatin (CRESTOR) 10 MG tablet TAKE 1 TABLET BY MOUTH EVERY DAY 90 tablet 0   Suvorexant ([name]) 20 MG TABS Take 20 mg by mouth  at bedtime. 30 tablet 5   No current facility-administered medications for this visit.    Allergies as of 12/07/2021 - Review Complete 12/07/2021  Allergen Reaction Noted   Levitra [vardenafil]  10/03/2013    Vitals: BP 118/63    Pulse (!) 55    Ht 5\' 11"  (1.803 m)    Wt 179 lb (81.2 kg)    BMI 24.97 kg/m  Last Weight:  Wt Readings from Last 1 Encounters:  12/07/21 179 lb (81.2 kg)   Last Height:   Ht Readings from Last 1 Encounters:  12/07/21 5\' 11"  (1.803 m)   Vision Screening:  Left eye with correction.                                    Right eye with correction 20/20.  Physical exam:  General: The patient is awake, alert and appears not in acute distress. The patient is well groomed. Head: Normocephalic, atraumatic. Neck is supple. Mallampati 2, neck circumference: 84.1 , TMJ click on the right.  Cardiovascular:  Regular rate and rhythm, Skin:  Without evidence of edema, or rash Trunk: BMI is elevated, and patient  has normal posture.  Neurologic exam : The patient is awake and alert, oriented to place and time.  Memory subjective  described as intact. There is a normal attention span & concentration ability.  Speech is fluent without dysarthria, there is a bit of  dysphonia , no aphasia.  Mood and affect are appropriate.no hallucinations.   Cranial nerves: Pupils are equal and briskly reactive to light. Funduscopic deferred.  Extraocular movements in vertical and horizontal planes intact and without nystagmus. Frequent blink.  Visual fields intact. Hearing to finger rub intact.  Facial sensation intact to fine touch- Facial motor  strength is symmetric and tongue and uvula move midline.  Motor exam: Normal tone and normal muscle bulk and symmetric normal strength in all extremities. Mild cog-wheeling.  Occupational therapy has seen him for tennis elbow.  Sensory:  Sense of fine touch and vibration were normal.  Coordination: Rapid alternating movements in the fingers/hands is tested and normal.  Finger-to-nose normal without evidence of ataxia, dysmetria or tremor. Gait and station: Patient walks without assistive device .  Strength within normal limits.  Stance is stable and normal.  Deep tendon reflexes: in the upper and lower extremities are symmetric and intact.  Babinski maneuver response is downgoing.  Assessment:  After physical and neurologic examination, review of laboratory studies, and pre-existing records, assessment :   1) Insomnia-  20 minute visit for refill, Lunesta, as he has failed Zolpidem and Doxepin.  the uses either lunesta or [name] not together!  , I will get  [name] refilled, at 20 mg . He gets about 4-5 hours of sleep currently.   2) Tremor developed over the last 7 years , mild, and may be essential or Seroquel related, no parkinsonian changes to tone and posture. No masked face.  Unilateral left cog-wheeling, lower shoulder on the left.  Tremor now unilateral right wrist - I had always rued out PD but we must look at this again.   I will order DAT scan   3) Parasomnia. Controlled on Klonopin.   Plan:  Treatment plan and additional workup will be reviewed under Problem List.  Refilled Seroquel/ for the next year. He liked [name]- will go to 20 mg.    Klonopin was prescribed by Dr Laurance Flatten,  who is now retired.i will take  this over.  He is prn using Lunesta.   Melatonin is used as needed, 5 mg or less dose at night. No recent incidents.    Larey Seat, MD

## 2021-12-08 LAB — COMPREHENSIVE METABOLIC PANEL
ALT: 14 IU/L (ref 0–44)
AST: 17 IU/L (ref 0–40)
Albumin/Globulin Ratio: 1.9 (ref 1.2–2.2)
Albumin: 4.6 g/dL (ref 3.7–4.7)
Alkaline Phosphatase: 51 IU/L (ref 44–121)
BUN/Creatinine Ratio: 15 (ref 10–24)
BUN: 15 mg/dL (ref 8–27)
Bilirubin Total: 1 mg/dL (ref 0.0–1.2)
CO2: 26 mmol/L (ref 20–29)
Calcium: 9.4 mg/dL (ref 8.6–10.2)
Chloride: 102 mmol/L (ref 96–106)
Creatinine, Ser: 0.98 mg/dL (ref 0.76–1.27)
Globulin, Total: 2.4 g/dL (ref 1.5–4.5)
Glucose: 105 mg/dL — ABNORMAL HIGH (ref 70–99)
Potassium: 4.8 mmol/L (ref 3.5–5.2)
Sodium: 141 mmol/L (ref 134–144)
Total Protein: 7 g/dL (ref 6.0–8.5)
eGFR: 81 mL/min/{1.73_m2} (ref >59)

## 2021-12-09 ENCOUNTER — Telehealth: Payer: Self-pay | Admitting: Neurology

## 2021-12-09 ENCOUNTER — Encounter: Payer: Self-pay | Admitting: Neurology

## 2021-12-09 NOTE — Progress Notes (Signed)
Normal non fasting CMET

## 2021-12-09 NOTE — Telephone Encounter (Signed)
[employer] medicare no auth req until 01/15/22 through [facility] ph # [phone] opt 6. If the patient has it before 01/15/22 it does not req and auth.. I sent the order to Nuclear medicine. They will reach out to the patient to schedule.

## 2021-12-23 ENCOUNTER — Encounter (HOSPITAL_COMMUNITY)
Admission: RE | Admit: 2021-12-23 | Discharge: 2021-12-23 | Disposition: A | Discharge status: Home / Self Care | Payer: Medicare Other | Source: Ambulatory Visit | Attending: Neurology | Admitting: Neurology

## 2021-12-23 ENCOUNTER — Other Ambulatory Visit: Payer: Self-pay

## 2021-12-23 DIAGNOSIS — G4759 Other parasomnia: Secondary | ICD-10-CM | POA: Diagnosis present

## 2021-12-23 DIAGNOSIS — G4750 Parasomnia, unspecified: Secondary | ICD-10-CM | POA: Diagnosis present

## 2021-12-23 DIAGNOSIS — G4701 Insomnia due to medical condition: Secondary | ICD-10-CM

## 2021-12-23 DIAGNOSIS — R251 Tremor, unspecified: Secondary | ICD-10-CM | POA: Insufficient documentation

## 2021-12-23 MED ORDER — POTASSIUM IODIDE (ANTIDOTE) 130 MG PO TABS
ORAL_TABLET | ORAL | Status: AC
  Administered 2021-12-23: 10:00:00 130 mg via ORAL
  Filled 2021-12-23: qty 1

## 2021-12-23 MED ORDER — IOFLUPANE I 123 185 MBQ/2.5ML IV SOLN
4.4000 | Freq: Once | INTRAVENOUS | Status: AC | PRN
  Administered 2021-12-23: 12:00:00 4.4 via INTRAVENOUS
  Filled 2021-12-23: qty 5

## 2021-12-23 MED ORDER — POTASSIUM IODIDE (ANTIDOTE) 130 MG PO TABS: 130.0000 mg | ORAL_TABLET | Freq: Once | ORAL | Status: DC

## 2021-12-23 NOTE — Progress Notes (Signed)
NORMAL DAT SCAN - Symmetric intense uptake within LEFT and RIGHT striata. The heads of ?the caudate nuclei and the posterior striata (putamen) are normal ?shape. No evidence of loss of dopamine transport populations in the ?basal ganglia. ?? ?IMPRESSION: ?Normal Ioflupane scan. No reduced radiotracer activity in basal ?ganglia to suggest Parkinson's syndrome pathology.

## 2021-12-27 ENCOUNTER — Other Ambulatory Visit: Payer: Self-pay | Admitting: Neurology

## 2021-12-27 ENCOUNTER — Encounter: Payer: Self-pay | Admitting: Neurology

## 2021-12-28 ENCOUNTER — Telehealth: Payer: Self-pay

## 2021-12-28 NOTE — Telephone Encounter (Signed)
-----   Message from Larey Seat, MD sent at 12/23/2021  4:41 PM EST ----- ? NORMAL DAT SCAN - Symmetric intense uptake within LEFT and RIGHT striata. The heads of ?the caudate nuclei and the posterior striata (putamen) are normal ?shape. No evidence of loss of dopamine transport populations in the ?basal ganglia. ?? ?IMPRESSION: ?Normal Ioflupane scan. No reduced radiotracer activity in basal ?ganglia to suggest Parkinson's syndrome pathology. ?

## 2021-12-28 NOTE — Telephone Encounter (Signed)
Pt called and informed of results per Dr. Brett Fairy. Pt had no questions at this time and was appreciative of call.  ?

## 2021-12-28 NOTE — Telephone Encounter (Signed)
LVM for pt to call office

## 2022-02-16 ENCOUNTER — Telehealth: Payer: Self-pay | Admitting: *Deleted

## 2022-02-16 NOTE — Telephone Encounter (Signed)
PA approved effective from 02/16/2022 through 02/17/2023. ?

## 2022-02-16 NOTE — Telephone Encounter (Signed)
Submitted PA [name] on CMM. Key: [id number] - Rx #: [id number]. Waiting on determination from [employer] Part D . ?

## 2022-05-01 ENCOUNTER — Other Ambulatory Visit: Payer: Self-pay | Admitting: Neurology

## 2022-05-01 DIAGNOSIS — G4759 Other parasomnia: Secondary | ICD-10-CM

## 2022-05-01 DIAGNOSIS — G4750 Parasomnia, unspecified: Secondary | ICD-10-CM

## 2022-05-01 DIAGNOSIS — G4701 Insomnia due to medical condition: Secondary | ICD-10-CM

## 2022-05-21 ENCOUNTER — Other Ambulatory Visit: Payer: Self-pay | Admitting: Neurology

## 2022-06-02 ENCOUNTER — Encounter: Payer: Self-pay | Admitting: Family Medicine

## 2022-06-16 ENCOUNTER — Encounter: Payer: Self-pay | Admitting: Internal Medicine

## 2022-06-27 NOTE — Progress Notes (Unsigned)
No chief complaint on file.   HISTORY OF PRESENT ILLNESS:  06/27/22 ALL:  Benjamin Maxwell is a 76 y.o. male here today for follow up for right hand tremor, insomnia and parasomnias. DaT scan was unremarkable. He continues clonazepam 0.'25mg'$ , quetiapine '50mg'$  and belsomra '20mg'$  QHS.    HISTORY (copied from Dr Dohmeier's previous note)  12-07-2021: Rv with Mr Dayveon Maxwell , now 76 years old, presents after MRI neck and brain-  we had referred to NS, but his chosen Neurosurgeon has retired, the patient was not informed by [facility], and was not given any other physician option. We will sent him locally to Dr. Alphonzo [place] team.  I will order a DAT scan for him to rule out Parkinsonism.  Tremor has improved with exercises. Grip strength too.    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 REM BD is a known precursor for PD and we will check today for explicitly.    09-02-2019, 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    Benjamin Maxwell is a 76 y.o. male here as a referral from Dr. Laurance Flatten for follow up on Parasomnia, treated with Klonopin. Seen in a RV on 05-28-2018, just returned from [place] and [place], and is in good health he has a mild resting tremor noted. Not pill-rolling tremor, and present when he lifts a coffee cup, holds a pen etc. He has no hoarseness, no dysphagia, and walks well on 2 "new knees ". PT was completed 3 weeks ago. He is climbing stairs well.    Parasomnia. Interval history from 05/23/2017, I have the pleasure of seeing Benjamin Maxwell, who has been a established patient with chronic insomnia. He has just returned from a trip to the [place]. He is in good health he continues to control sleep walking / parasomnias with Klonopin, and insomnia prn with Lunesta.   HPI:  05-19-2016. The patient has been followed here for almost 12 years. He is an active traveler and has just traveled again through [place], [place] and [place].   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 As past medical history I need to add  that he had altitude sickness while (2013)  in [place], when he reached the 5000 m range. His  insomnia in high altitude was probably related to central apnea . The patient's bedtime is around 10 PM he arises at about 6:30 in the morning, he break spontaneously and does not need an alarm. We can speak days a fairly similar in sleep times he may have one bathroom break at night. There is no history of falls at night of dizziness or light headedness. He failed Doxepin and Ambien, needs to go back on Lunesta.    REVIEW OF SYSTEMS: Out of a complete 14 system review of symptoms, the patient complains only of the following symptoms, and all other reviewed systems are negative.   ALLERGIES: Allergies  Allergen Reactions   Levitra [Vardenafil]      HOME MEDICATIONS: Outpatient Medications Prior to Visit  Medication Sig Dispense Refill   Cholecalciferol 25 MCG (1000 UT) tablet Take by mouth.     clonazePAM (KLONOPIN) 0.5 MG tablet TAKE 1/2 TABLET BY MOUTH DAILY AS DIRECTED 45 tablet 4   Eszopiclone 3 MG TABS Take 1 tablet (  3 mg total) by mouth at bedtime. Take immediately before bedtime 90 tablet 3   Icosapent Ethyl (VASCEPA) 1 g CAPS Take 2 capsules (2 g total) by mouth 2 (two) times daily. 120 capsule 3   levothyroxine (SYNTHROID) 50 MCG tablet TAKE 1 TABLET BY MOUTH DAILY (Patient taking differently: Take 50-75 mcg by mouth daily before breakfast. Mon-Friday 75 mcg and sat and Sunday 50 mcg.) 90 tablet 2   levothyroxine (SYNTHROID) 75 MCG tablet Take 1 tablet by mouth as directed. 1 tablet by mouth Monday through Friday     metoprolol succinate (TOPROL-XL) 25 MG 24 hr tablet TAKE 1 TABLET(25 MG) BY MOUTH DAILY 90 tablet 3   Multiple  Vitamin (MULTI-VITAMINS) TABS Take by mouth.     QUEtiapine (SEROQUEL) 50 MG tablet Take 1 tablet (50 mg total) by mouth at bedtime. 90 tablet 0   rosuvastatin (CRESTOR) 10 MG tablet TAKE 1 TABLET BY MOUTH EVERY DAY 90 tablet 0   Suvorexant ([name]) 20 MG TABS Take 20 mg by mouth at bedtime. 30 tablet 5   No facility-administered medications prior to visit.     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     PAST SURGICAL HISTORY: Past Surgical History:  Procedure Laterality Date   CARDIAC ELECTROPHYSIOLOGY STUDY AND ABLATION     KNEE SURGERY Bilateral    UMBILICAL HERNIA REPAIR       FAMILY HISTORY: Family History  Problem Relation Age of Onset   Atrial fibrillation Mother    Pneumonia Father    Cancer Father    Colon cancer Neg Hx    Esophageal cancer Neg Hx    Rectal cancer Neg Hx    Stomach cancer Neg Hx      SOCIAL HISTORY: Social History   Socioeconomic History   Marital status: Divorced    Spouse name: Not on file   Number of children: 2   Years of education: college   Highest education level: Not on file  Occupational History   Occupation: Tree surgeon    Comment: [employer]  Tobacco Use   Smoking status: Never   Smokeless tobacco: Never  Vaping Use   Vaping Use: Never used  Substance and Sexual Activity   Alcohol use: Yes    Comment: Social   Drug use: No   Sexual activity: Not on file  Other Topics Concern   Not on file  Social History Narrative   Patient is a Tree surgeon, works full time at [employer]. Patient has a college education.  Patient is divorced.  Lives alone   Patient is right-handed.   Patient drinks very little caffeine (tea).   Social Determinants of Health   Financial Resource Strain: Not on file  Food Insecurity: Not on file  Transportation Needs: Not on file  Physical Activity: Not on file  Stress: Not on file  Social Connections: Not on file  Intimate Partner Violence: Not on file     PHYSICAL EXAM  There were no vitals filed for this  visit. There is no height or weight on file to calculate BMI.  Generalized: Well developed, in no acute distress  Cardiology: normal rate and rhythm, no murmur auscultated  Respiratory: clear to auscultation bilaterally    Neurological examination  Mentation: Alert oriented to time, place, history taking. Follows all commands speech and language fluent Cranial nerve II-XII: Pupils were equal round reactive to light. Extraocular movements were full, visual field were full on confrontational test. Facial sensation and strength were normal. Uvula tongue midline. Head turning and shoulder shrug  were normal and symmetric. Motor: The motor testing reveals 5 over 5 strength of all 4 extremities. Good symmetric motor tone is noted throughout.  Sensory: Sensory testing is intact to soft touch on all 4 extremities. No evidence of extinction is noted.  Coordination: Cerebellar testing reveals good finger-nose-finger and heel-to-shin bilaterally.  Gait and station: Gait is normal. Tandem gait is normal. Romberg is negative. No drift is seen.  Reflexes: Deep tendon reflexes are symmetric and normal bilaterally.    DIAGNOSTIC DATA (LABS, IMAGING, TESTING) - I reviewed patient records, labs, notes, testing and imaging myself where available.  Lab Results  Component Value Date   WBC 7.1 04/10/2019   HGB 14.0 04/10/2019   HCT 42.0 04/10/2019   MCV 89 04/10/2019   PLT 213 04/10/2019      Component Value Date/Time   NA 141 12/07/2021 1136   K 4.8 12/07/2021 1136   CL  102 12/07/2021 1136   CO2 26 12/07/2021 1136   GLUCOSE 105 (H) 12/07/2021 1136   BUN 15 12/07/2021 1136   CREATININE 0.98 12/07/2021 1136   CALCIUM 9.4 12/07/2021 1136   PROT 7.0 12/07/2021 1136   ALBUMIN 4.6 12/07/2021 1136   AST 17 12/07/2021 1136   ALT 14 12/07/2021 1136   ALKPHOS 51 12/07/2021 1136   BILITOT 1.0 12/07/2021 1136   GFRNONAA 67 04/10/2019 1124   GFRAA 77 04/10/2019 1124   Lab Results  Component Value Date   CHOL 143 04/10/2019   HDL 55 04/10/2019   LDLCALC 75 04/10/2019   TRIG 66 04/10/2019   CHOLHDL 2.6 04/10/2019   No results found for: "HGBA1C" No results found for: "VITAMINB12" Lab Results  Component Value Date   TSH 1.360 04/10/2019        No data to display               No data to display           ASSESSMENT AND PLAN  76 y.o. year old male  has a past medical history of Allergy, Arthritis, Cancer ([place]), CHF (congestive heart failure) ([place]), Diverticulosis, Dysfunctions associated with sleep stages or arousal from sleep, Hyperlipidemia, Hypothyroid, Pulmonary nodules, Right carpal tunnel syndrome (06/26/2018), Spondylolysis, Syncope and collapse, and Ulnar neuropathy at elbow (06/26/2018). here with    Tremor of right hand  Parasomnia, organic  Insomnia due to medical condition  Brion Aliment ***.  Healthy lifestyle habits encouraged. *** will follow up with PCP as directed. *** will return to see me in ***, sooner if needed. *** verbalizes understanding and agreement with this plan.   No orders of the defined types were placed in this encounter.    No orders of the defined types were placed in this encounter.    Debbora Presto, MSN, FNP-C 06/27/2022, 8:09 AM  [facility] [address] [place], [place] [address] [phone]

## 2022-06-27 NOTE — Patient Instructions (Signed)
Below is our plan:  We will continue [name] or Lunesta daily. Continue clonazepam 0.25mg  as needed and quetiapine 50mg  daily.   Please make sure you are staying well hydrated. I recommend 50-60 ounces daily. Well balanced diet and regular exercise encouraged. Consistent sleep schedule with 6-8 hours recommended.   Please continue follow up with care team as directed.   Follow up with Dr Vickey Huger in 6 months   You may receive a survey regarding today's visit. I encourage you to leave honest feed back as I do use this information to improve patient care. Thank you for seeing me today!

## 2022-06-28 ENCOUNTER — Encounter: Payer: Self-pay | Admitting: Family Medicine

## 2022-06-28 ENCOUNTER — Ambulatory Visit: Payer: Medicare Other | Admitting: Family Medicine

## 2022-06-28 VITALS — BP 119/72 | HR 55 | Ht 70.0 in | Wt 166.0 lb

## 2022-06-28 DIAGNOSIS — G4750 Parasomnia, unspecified: Secondary | ICD-10-CM

## 2022-06-28 DIAGNOSIS — G4701 Insomnia due to medical condition: Secondary | ICD-10-CM | POA: Diagnosis not present

## 2022-06-28 DIAGNOSIS — G4759 Other parasomnia: Secondary | ICD-10-CM | POA: Diagnosis not present

## 2022-06-28 DIAGNOSIS — R251 Tremor, unspecified: Secondary | ICD-10-CM

## 2022-06-28 MED ORDER — CLONAZEPAM 0.5 MG PO TABS: 0.5000 mg | ORAL_TABLET | Freq: Every day | ORAL | 1 refills | Status: DC | PRN

## 2022-06-28 MED ORDER — QUETIAPINE FUMARATE 50 MG PO TABS: 50.0000 mg | ORAL_TABLET | Freq: Every day | ORAL | 1 refills | Status: DC

## 2022-06-28 MED ORDER — ESZOPICLONE 3 MG PO TABS: 3.0000 mg | ORAL_TABLET | Freq: Every day | ORAL | 1 refills | Status: DC

## 2022-08-24 ENCOUNTER — Other Ambulatory Visit: Payer: Self-pay | Admitting: Family Medicine

## 2022-08-24 ENCOUNTER — Telehealth: Payer: Self-pay | Admitting: Family Medicine

## 2022-08-24 ENCOUNTER — Telehealth: Payer: Self-pay | Admitting: Neurology

## 2022-08-24 NOTE — Telephone Encounter (Signed)
Reviewed pt chart. Appears previous PA eszopicoline expired 06/23/22.  We will submit PA request to renew auth on covermymeds.

## 2022-08-24 NOTE — Telephone Encounter (Signed)
Pt is requesting a refill for [name] 15 MG TABS .  Pharmacy: [facility] [phone]

## 2022-08-24 NOTE — Telephone Encounter (Signed)
PA completed on CMM/[employer] medicare [id number]  Will await determination

## 2022-08-24 NOTE — Telephone Encounter (Addendum)
Pt states every year Dr Brett Fairy has to fax his insurance company([employer]) a letter informing them to approve his Eszopiclone 3 MG TABS , Pt does not have the phone or fax to provide.

## 2022-08-25 MED ORDER — BELSOMRA 20 MG PO TABS: 20.0000 mg | ORAL_TABLET | Freq: Every day | ORAL | 5 refills | Status: DC

## 2022-08-25 NOTE — Telephone Encounter (Signed)
Called and LVM for pt letting him know PA approved effective from 08/24/2022 through 08/25/2023. He should now be able to fill at his pharmacy. If he has any difficulty, asked him to call back and let us know.

## 2022-08-25 NOTE — Telephone Encounter (Signed)
PA approved effective from 08/24/2022 through 08/25/2023.

## 2022-10-26 ENCOUNTER — Other Ambulatory Visit: Payer: Self-pay

## 2022-10-26 MED ORDER — CLONAZEPAM 0.5 MG PO TABS: 0.5000 mg | ORAL_TABLET | Freq: Every day | ORAL | 1 refills | Status: DC | PRN

## 2022-12-08 ENCOUNTER — Other Ambulatory Visit: Payer: Self-pay | Admitting: Neurology

## 2022-12-08 DIAGNOSIS — G4750 Parasomnia, unspecified: Secondary | ICD-10-CM

## 2022-12-08 DIAGNOSIS — G4701 Insomnia due to medical condition: Secondary | ICD-10-CM

## 2022-12-08 DIAGNOSIS — G4759 Other parasomnia: Secondary | ICD-10-CM

## 2022-12-27 ENCOUNTER — Encounter: Payer: Self-pay | Admitting: Neurology

## 2022-12-27 ENCOUNTER — Ambulatory Visit: Payer: Medicare Other | Admitting: Neurology

## 2022-12-27 DIAGNOSIS — G4759 Other parasomnia: Secondary | ICD-10-CM | POA: Diagnosis not present

## 2022-12-27 DIAGNOSIS — G4701 Insomnia due to medical condition: Secondary | ICD-10-CM

## 2022-12-27 DIAGNOSIS — G4750 Parasomnia, unspecified: Secondary | ICD-10-CM

## 2022-12-27 MED ORDER — CLONAZEPAM 0.5 MG PO TABS: 0.5000 mg | ORAL_TABLET | Freq: Every day | ORAL | 1 refills | Status: DC | PRN

## 2022-12-27 MED ORDER — QUETIAPINE FUMARATE 50 MG PO TABS: ORAL_TABLET | ORAL | 0 refills | Status: DC

## 2022-12-27 MED ORDER — BELSOMRA 20 MG PO TABS: 20.0000 mg | ORAL_TABLET | Freq: Every day | ORAL | 5 refills | Status: DC

## 2022-12-27 MED ORDER — ESZOPICLONE 3 MG PO TABS: 3.0000 mg | ORAL_TABLET | Freq: Every day | ORAL | 1 refills | Status: DC

## 2022-12-27 NOTE — Progress Notes (Signed)
Provider:  Larey Seat, MD  Primary Care Physician:  Dorris Fetch, MD [place] [id number]     Referring Provider: Dorris Fetch, [address],  [place] [id number]          Chief Complaint according to patient   Patient presents with:     New Concern ?      Here for follow up. Overall medications are working well.       HISTORY OF PRESENT ILLNESS:  Benjamin Maxwell is a 77 y.o. male patient who is here for revisit 12/27/2022 for  follow up on parasomnia, insomnia, sleep walking.  Chief concern according to patient :  grip strength has recovered, right hand tremor has not progressed, he still travels. We are meeting for a refill he takes Lunesta nightly. If he does not sleep well, he will take [name] the following night. He usually takes clonazepam 1 tablet 12-15 times a month when having more parasomnias.  Amy Lomax; 06/28/22 ALL:  Benjamin Maxwell is a 77 y.o. male here today for follow up for right hand tremor, insomnia and parasomnias. DaT scan was unremarkable. He continues clonazepam 0.5 mg, quetiapine '50mg'$ , and Lunesta '3mg'$  or [name] '20mg'$ . He reports that he alternated medications. He usually takes Lunesta nightly. If he does not sleep well, he will take [name] the following night. He usually takes clonazepam 1 tablet 12-15 times a month when having more parasomnias. Tremor is stable. No worsening neck pain or weakness. He is planning to have subcutaneous cyst removed from right posterior neck net week with Dr Ron Parker, [employer]. He is status post bilateral knee replacements. He is having some sharp pains of right knee following but is well managed on gabapentin.     12-07-2021: Rv with [name] , now 77 years old, presents after MRI neck and brain-  we had referred to NS, but his chosen Neurosurgeon has retired, the patient was not informed by [facility], and was not given any other physician option. We will sent him  locally to Dr. Alphonzo Dublin team.  I will order a DAT scan for him to rule out Parkinsonism.  Tremor has improved with exercises. Grip strength too.      09-01-2020: 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 REM BD is a known precursor for PD and we will check today for explicitly.    09-02-2019, 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    Benjamin Maxwell is a 77 y.o. male here as a referral from Dr. Laurance Flatten for follow up on Parasomnia, treated with Klonopin. Seen in a RV on 05-28-2018, just returned from [place] and [place], and is in good health he has a mild resting tremor noted. Not pill-rolling tremor, and present when he lifts a coffee cup, holds a pen etc. He has no hoarseness, no dysphagia, and walks well on 2 "new knees ". PT was completed 3 weeks ago. He is climbing stairs well.    Parasomnia. Interval history from 05/23/2017, I have the pleasure of seeing [name], who has been a established patient with chronic insomnia. He has just returned from a trip to the [place]. He is in good health he continues to control sleep walking /  parasomnias with Klonopin, and insomnia prn with Lunesta.     HPI: 05-19-2016. The patient has been followed here for almost 12 years. He is an active traveler and has just traveled again through [place], [place] and [place].  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 As past medical history I need to add  that he had altitude sickness while (2013)  in [place], when he reached the 5000 m range. His  insomnia in high altitude was probably related to central apnea . The patient's bedtime is around 10 PM he arises at about 6:30 in the morning, he break spontaneously and does not need an alarm. We can speak days a fairly similar in sleep  times he may have one bathroom break at night. There is no history of falls at night of dizziness or light headedness. He failed Doxepin and Ambien, needs to go back on Lunesta.      Review of Systems: Out of a complete 14 system review, the patient complains of only the following symptoms, and all other reviewed systems are negative.:  Fatigue, sleepiness on medication( gabapentin)  parasomnia and insomnia.    How likely are you to doze in the following situations: 0 = not likely, 1 = slight chance, 2 = moderate chance, 3 = high chance   Sitting and Reading? Watching Television? Sitting inactive in a public place (theater or meeting)? As a passenger in a car for an hour without a break? Lying down in the afternoon when circumstances permit? Sitting and talking to someone? Sitting quietly after lunch without alcohol? In a car, while stopped for a few minutes in traffic?   Total = 2/ 24 points   FSS endorsed at 26/ 63 points.   Social History   Socioeconomic History   Marital status: Divorced    Spouse name: Not on file   Number of children: 2   Years of education: college   Highest education level: Not on file  Occupational History   Occupation: Tree surgeon    Comment: [employer]  Tobacco Use   Smoking status: Never   Smokeless tobacco: Never  Vaping Use   Vaping Use: Never used  Substance and Sexual Activity   Alcohol use: Yes    Comment: Social   Drug use: No   Sexual activity: Not on file  Other Topics Concern   Not on file  Social History Narrative   Patient is a Tree surgeon, works full time at [employer]. Patient has a college education. Patient is divorced.  Lives alone   Patient is right-handed.   Patient drinks very little caffeine (tea).   Social Determinants of Health   Financial Resource Strain: Not on file  Food Insecurity: Not on file  Transportation Needs: Not on file  Physical Activity: Not on file  Stress: Not on file  Social Connections: Not  on file    Family History  Problem Relation Age of Onset   Atrial fibrillation Mother    Pneumonia Father    Cancer Father    Colon cancer Neg Hx    Esophageal cancer Neg Hx    Rectal cancer Neg Hx    Stomach cancer Neg Hx     Past Medical History:  Diagnosis Date   Allergy    Arthritis    Cancer ([place])    Prostate, followed by urology   CHF (congestive heart failure) ([place])    Diverticulosis    Dysfunctions associated with sleep stages or arousal from  sleep    Hyperlipidemia    Hypothyroid    Pulmonary nodules    Right carpal tunnel syndrome 06/26/2018   Spondylolysis    Syncope and collapse    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     clonazePAM (KLONOPIN) 0.5 MG tablet Take 1 tablet (0.5 mg total) by mouth daily as needed for anxiety. 45 tablet 1   Eszopiclone 3 MG TABS Take 1 tablet (3 mg total) by mouth at bedtime. Take immediately before bedtime 90 tablet 1   gabapentin (NEURONTIN) 300 MG capsule Take 300 mg by mouth daily.     Icosapent Ethyl (VASCEPA) 1 g CAPS Take 2 capsules (2 g total) by mouth 2 (two) times daily. 120 capsule 3   levothyroxine (SYNTHROID) 50 MCG tablet TAKE 1 TABLET BY MOUTH DAILY (Patient taking differently: Take 50-75 mcg by mouth daily before breakfast. Mon-Friday 75 mcg and sat and Sunday 50 mcg.) 90 tablet 2   levothyroxine (SYNTHROID) 75 MCG tablet Take 1 tablet by mouth as directed. 1 tablet by mouth Monday through Friday     metoprolol succinate (TOPROL-XL) 25 MG 24 hr tablet TAKE 1 TABLET(25 MG) BY MOUTH DAILY 90 tablet 3   Multiple Vitamin (MULTI-VITAMINS) TABS Take by mouth.     QUEtiapine (SEROQUEL) 50 MG tablet TAKE 1  TABLET(50 MG) BY MOUTH AT BEDTIME 90 tablet 0   rosuvastatin (CRESTOR) 10 MG tablet TAKE 1 TABLET BY MOUTH EVERY DAY 90 tablet 0   Suvorexant ([name]) 20 MG TABS Take 20 mg by mouth at bedtime. 30 tablet 5   No current facility-administered medications on file prior to visit.    Allergies  Allergen Reactions   Levitra [Vardenafil]      DIAGNOSTIC DATA (LABS, IMAGING, TESTING) - I reviewed patient records, labs, notes, testing and imaging myself where available.  Lab Results  Component Value Date   WBC 7.1 04/10/2019   HGB 14.0 04/10/2019   HCT 42.0 04/10/2019   MCV 89 04/10/2019   PLT 213 04/10/2019      Component Value Date/Time   NA 141 12/07/2021 1136   K 4.8 12/07/2021 1136   CL 102 12/07/2021 1136   CO2 26 12/07/2021 1136   GLUCOSE 105 (H) 12/07/2021 1136   BUN 15 12/07/2021 1136   CREATININE 0.98 12/07/2021 1136   CALCIUM 9.4 12/07/2021 1136   PROT 7.0 12/07/2021 1136   ALBUMIN 4.6 12/07/2021 1136   AST 17 12/07/2021 1136   ALT 14 12/07/2021 1136   ALKPHOS 51 12/07/2021 1136   BILITOT 1.0 12/07/2021 1136   GFRNONAA 67 04/10/2019 1124   GFRAA 77 04/10/2019 1124   Lab Results  Component Value Date   CHOL 143 04/10/2019   HDL 55 04/10/2019   LDLCALC 75 04/10/2019   TRIG 66 04/10/2019   CHOLHDL 2.6 04/10/2019   No results found for: "HGBA1C" No results found for: "VITAMINB12" Lab Results  Component Value Date   TSH 1.360 04/10/2019    PHYSICAL EXAM:  Today's Vitals   12/27/22 1051  BP: 118/72  Pulse: (!) 55  Weight: 172 lb (78 kg)  Height: '5\' 10"'$  (1.778 m)  Body mass index is 24.68 kg/m.   Wt Readings from Last 3 Encounters:  12/27/22 172 lb (78 kg)  06/28/22 166 lb (75.3 kg)  12/07/21 179 lb (81.2 kg)     Ht Readings from Last 3 Encounters:  12/27/22 '5\' 10"'$  (D34-534 m)  06/28/22 '5\' 10"'$  (1.778 m)  12/07/21 '5\' 11"'$  (1.803 m)      General: The patient is awake, alert and appears not in acute distress. The patient is well groomed. Head:  Normocephalic, atraumatic. Neck is supple.  Mallampati 2,  ,Cardiovascular:  Regular rate and cardiac rhythm by pulse,  without distended neck veins. Respiratory: Lungs are clear to auscultation.  Skin:  Without evidence of ankle edema, or rash. Trunk: The patient's posture is erect.   NEUROLOGIC EXAM: The patient is awake and alert, oriented to place and time.   Memory subjective described as intact.  Attention span & concentration ability appears normal.  Speech is fluent,  without  dysarthria, dysphonia or aphasia.  Mood and affect are appropriate.   Cranial nerves: no loss of smell or taste reported  Pupils are equal and briskly reactive to light. Funduscopic exam deferred.  Extraocular movements in vertical and horizontal planes were intact and without nystagmus. No Diplopia. Visual fields by finger perimetry are intact. Hearing was intact to soft voice and finger rubbing.    Facial sensation intact to fine touch.  Facial motor strength is symmetric and tongue and uvula move midline. No tremor of the tongue.  Neck ROM : rotation, tilt and flexion extension were normal for age and shoulder shrug was symmetrical.  Shoulders appear droopy.    Motor exam:  Symmetric bulk, tone and ROM.   Normal tone without cogwheeling, symmetric grip strength .   Sensory:  Fine touch, pinprick and vibration were tested  and  normal.  Proprioception tested in the upper extremities was normal.   Coordination: Rapid alternating movements in the fingers/hands were of normal speed.  The Finger-to-nose maneuver was intact without evidence of ataxia, dysmetria or tremor.   Gait and station: Patient could rise unassisted from a seated position, walked without assistive device.  Stance is of normal width/ base and the patient turned with 3 steps.  Toe and heel walk were deferred.  Deep tendon reflexes: in the  upper extremities are symmetric and intact.  Babinski response was deferred .  ASSESSMENT  AND PLAN 77 y.o. year old male  here with:    1) parasomnia, treated with klonopin and ( long term ) seroquel  2) insomnia , responding to lunesta, belsomra    3) no secondary parkinsonism found.  Tremor improved, grip strength recovered, no dysphonia and no dysphagia. No titubation.    I plan to follow up either personally or through our NP within 6 months, alternating with me.  I would like to thank Dorris Fetch, MD and Dorris Fetch, [address],  [place] [id number] for allowing me to meet with and to take care of this pleasant patient.     After spending a total time of  35  minutes face to face and additional time for physical and neurologic examination, review of laboratory studies,  personal review of imaging studies, reports and results of other testing and review of referral information / records as far as provided in visit,   Electronically signed by: Larey Seat, MD 12/27/2022 11:19 AM  [facility] certified by [employer] of Sleep Medicine and  Diplomate of the [employer] of Sleep Medicine. Board certified In Neurology through the [place], Fellow of the [employer] of Neurology. Medical Director of [employer].

## 2022-12-27 NOTE — Patient Instructions (Signed)
Suvorexant Tablets What is this medication? SUVOREXANT (SOO voe REX ant) treats insomnia. It helps you go to sleep faster and stay asleep through the night. This medicine may be used for other purposes; ask your health care provider or pharmacist if you have questions. COMMON BRAND NAME(S): [name] What should I tell my care team before I take this medication? They need to know if you have any of these conditions: Depression History of substance abuse or addiction History of a sudden onset of muscle weakness (cataplexy) History of falling asleep often at unexpected times (narcolepsy) If you often drink alcohol Liver disease Lung or breathing disease Sleep apnea Sleep-walking, driving, eating or other activity while not fully awake after taking a sleep medication Suicidal thoughts, plans, or attempt; a previous suicide attempt by you or a family member An unusual or allergic reaction to suvorexant, other medications, foods, dyes, or preservatives Pregnant or trying to get pregnant Breast-feeding How should I use this medication? Take this medication by mouth with water 30 minutes before going to bed. Follow the directions on the prescription label. It is better to take this medication on an empty stomach. Do not take your medication more often than directed. A special MedGuide will be given to you by the pharmacist with each prescription and refill. Be sure to read this information carefully each time. Talk to your care team about the use of this medication in children. Special care may be needed. Overdosage: If you think you have taken too much of this medicine contact a poison control center or emergency room at once. NOTE: This medicine is only for you. Do not share this medicine with others. What if I miss a dose? This does not apply. This medication should only be taken as directed before going to sleep. Do not take double or extra doses. What may interact with this  medication? Alcohol Antihistamines for allergy, cough, or cold Certain antibiotics, such as erythromycin or clarithromycin Certain antivirals for HIV or hepatitis Certain medications for fungal infections, such as itraconazole, ketoconazole, posaconazole Certain medications for mental health conditions Certain medications for seizures, such as carbamazepine, phenytoin, phenobarbital Conivaptan Diltiazem General anesthetics, such as halothane, isoflurane, methoxyflurane, propofol Grapefruit juice Medications that relax muscles for surgery Opioid medications for pain Other medications for sleep Rifampin St. John's Wort Verapamil This list may not describe all possible interactions. Give your health care provider a list of all the medicines, herbs, non-prescription drugs, or dietary supplements you use. Also tell them if you smoke, drink alcohol, or use illegal drugs. Some items may interact with your medicine. What should I watch for while using this medication? Visit your care team for regular checks on your progress. Keep a regular sleep schedule by going to bed at about the same time each night. Avoid caffeine-containing drinks in the evening hours. Talk to your care team if your insomnia worsens or is not better within 7 to 10 days. After taking this medication, you may get up out of bed and do an activity that you do not know you are doing. The next morning, you may have no memory of this. Activities include driving a car ("sleep-driving"), making and eating food, talking on the phone, sexual activity, and sleep-walking. Serious injuries have occurred. Stop the medication and call your care team right away if you find out you have done any of these activities. Do not take this medication if you have used alcohol that evening. Do not take it if you have taken another   medication for sleep. The risk of doing these sleep-related activities is higher. Do not take this medication unless you are  able to stay in bed for a full night (7 to 8 hours) before you must be active again. Tell your care team if you will need to perform activities requiring full alertness, such as driving, the next day. You may have a decrease in mental alertness the day after use, even if you feel that you are fully awake. Do not stand or sit up quickly after taking this medication, especially if you are an older patient. This reduces the risk of dizzy or fainting spells. If you or your family notice any changes in your moods or behavior, such as new or worsening depression, thoughts of harming yourself, anxiety, other unusual or disturbing thoughts, or memory loss, call your care team right away. After you stop taking this medication, you may have trouble falling asleep. This is called rebound insomnia. This problem usually goes away on its own after 1 or 2 nights. What side effects may I notice from receiving this medication? Side effects that you should report to your care team as soon as possible: Allergic reactions--skin rash, itching, hives, swelling of the face, lips, tongue, or throat CNS depression--slow or shallow breathing, shortness of breath, feeling faint, dizziness, confusion, trouble staying awake Mood and behavior changes--anxiety, nervousness, confusion, hallucinations, irritability, hostility, thoughts of suicide or self-harm, worsening mood, feelings of depression Sudden and temporary muscle weakness Unable to move or speak for several minutes upon waking or going to sleep Unusual sleep behaviors or activities you do not remember, such as driving, eating, or sexual activity Side effects that usually do not require medical attention (report these to your care team if they continue or are bothersome): Drowsiness the day after use Vivid dreams or nightmares This list may not describe all possible side effects. Call your doctor for medical advice about side effects. You may report side effects to FDA at  1-800-FDA-1088. Where should I keep my medication? Keep out of the reach of children and pets. This medication can be abused. Keep it in a safe place to protect it from theft. Do not share it with anyone. It is only for you. Selling or giving away this medication is dangerous and against the law. Store between 20 and 25 degrees C (68 and 77 degrees F). Protect from light and moisture. Keep the container tightly closed. Get rid of any unused medication after the expiration date. This medication may cause harm and death if it is taken by other adults, children, or pets. It is important to get rid of the medication as soon as you no longer need it or it is expired. You can do this in two ways: Take the medication to a medication take-back program. Check with your pharmacy or law enforcement to find a location. If you cannot return the medication, check the label or package insert to see if the medication should be thrown out in the garbage or flushed down the toilet. If you are not sure, ask your care team. If it is safe to put it in the trash, take the medication out of the container. Mix the medication with cat litter, dirt, coffee grounds, or other unwanted substance. Seal the mixture in a bag or container. Put it in the trash. NOTE: This sheet is a summary. It may not cover all possible information. If you have questions about this medicine, talk to your doctor, pharmacist, or health care provider.    2023 Elsevier/Gold Standard (2020-11-05 00:00:00)  

## 2023-05-03 ENCOUNTER — Other Ambulatory Visit: Payer: Self-pay | Admitting: Neurology

## 2023-05-03 DIAGNOSIS — G4701 Insomnia due to medical condition: Secondary | ICD-10-CM

## 2023-05-03 DIAGNOSIS — G4750 Parasomnia, unspecified: Secondary | ICD-10-CM

## 2023-05-03 DIAGNOSIS — G4759 Other parasomnia: Secondary | ICD-10-CM

## 2023-05-04 ENCOUNTER — Other Ambulatory Visit: Payer: Self-pay

## 2023-05-05 ENCOUNTER — Other Ambulatory Visit (HOSPITAL_COMMUNITY): Payer: Self-pay

## 2023-05-05 ENCOUNTER — Telehealth: Payer: Self-pay

## 2023-05-05 NOTE — Telephone Encounter (Signed)
Pharmacy Patient Advocate Encounter   Received notification from CoverMyMeds that prior authorization for [name] 20MG  tablets is required/requested.   Insurance verification completed.   The patient is insured through [facility] .   Per test claim: PA started via CoverMyMeds. KEY [id number] . Waiting for clinical questions to populate.

## 2023-05-07 NOTE — Telephone Encounter (Signed)
 Clinical questions submitted-awaiting determination

## 2023-05-08 NOTE — Telephone Encounter (Signed)
[employer]) Authorization has been approved thru 05/07/23-05/09/24 If you have any questions can contact at [phone] option 5.

## 2023-06-14 ENCOUNTER — Other Ambulatory Visit: Payer: Self-pay | Admitting: Neurology

## 2023-06-16 MED ORDER — CLONAZEPAM 0.5 MG PO TABS: 0.5000 mg | ORAL_TABLET | Freq: Every day | ORAL | 1 refills | Status: DC | PRN

## 2023-07-11 NOTE — Progress Notes (Unsigned)
No chief complaint on file.   HISTORY OF PRESENT ILLNESS:  07/11/23 ALL:  Benjamin Maxwell returns for follow up for parasomnia and insomnia. He continues continues clonazepam 0.5 mg, quetiapine 50mg , and Lunesta 3mg  or [name] 20mg .    Tremor?   12/27/2022 CD: Benjamin Maxwell is a 77 y.o. male patient who is here for revisit 12/27/2022 for  follow up on parasomnia, insomnia, sleep walking.  Chief concern according to patient :  grip strength has recovered, right hand tremor has not progressed, he still travels. We are meeting for a refill he takes Lunesta nightly. If he does not sleep well, he will take [name] the following night. He usually takes clonazepam 1 tablet 12-15 times a month when having more parasomnias.  06/28/2022 ALL: Benjamin Maxwell is a 77 y.o. male here today for follow up for right hand tremor, insomnia and parasomnias. DaT scan was unremarkable. He continues clonazepam 0.5 mg, quetiapine 50mg , and Lunesta 3mg  or [name] 20mg . He reports that he alternated medications. He usually takes Lunesta nightly. If he does not sleep well, he will take [name] the following night. He usually takes clonazepam 1 tablet 12-15 times a month when having more parasomnias. Tremor is stable. No worsening neck pain or weakness. He is planning to have subcutaneous cyst removed from right posterior neck net week with Dr Myrtis Ser, [facility]. He is status post bilateral knee replacements. He is having some sharp pains of right knee following but is well managed on gabapentin.   HISTORY (copied from Dr Dohmeier's previous note)  12-07-2021: Rv with [name] , now 77 years old, presents after MRI neck and brain-  we had referred to NS, but his chosen Neurosurgeon has retired, the patient was not informed by [facility], and was not given any other physician option. We will sent him locally to Dr. Joesphine Bare team.  I will order a DAT scan for him to rule out Parkinsonism.  Tremor has improved with exercises. Grip  strength too.    09-01-2020:  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 REM BD is a known precursor for PD and we will check today for explicitly.    09-02-2019, 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    Benjamin Maxwell is a 77 y.o. male here as a referral from Dr. Christell Constant for follow up on Parasomnia, treated with Klonopin. Seen in a RV on 05-28-2018, just returned from [place] and [place], and is in good health he has a mild resting tremor noted. Not pill-rolling tremor, and present when he lifts a coffee cup, holds a pen etc. He has no hoarseness, no dysphagia, and walks well on 2 "new knees ". PT was completed 3 weeks ago. He is climbing stairs well.    Parasomnia. Interval history from 05/23/2017, I have the pleasure of seeing [name], who has been a established patient with chronic insomnia. He has just returned from a trip to the [place]. He is in good health he continues to control sleep walking / parasomnias with Klonopin, and insomnia prn with Lunesta.   HPI: 05-19-2016. The patient has been followed here for almost 12 years. He is an active traveler and has just traveled again through [place], [place] and [place].   The patient has a parasomnia that has been controlled  well with Klonopin. As past medical history I need to add  that he had altitude sickness while (2013)  in [place], when he reached the 5000 m range. His  insomnia in high altitude was probably related to central apnea . The patient's bedtime is around 10 PM he arises at about 6:30 in the morning, he break spontaneously and does not need an alarm. We can speak days a fairly similar in sleep times he may have one bathroom break at night. There is no history of falls at night of dizziness or light headedness. He failed Doxepin  and Ambien, needs to go back on Lunesta.    REVIEW OF SYSTEMS: Out of a complete 14 system review of symptoms, the patient complains only of the following symptoms, insomnia, parasomnias, depression, right knee pain, and all other reviewed systems are negative.   ALLERGIES: Allergies  Allergen Reactions   Levitra [Vardenafil]      HOME MEDICATIONS: Outpatient Medications Prior to Visit  Medication Sig Dispense Refill   Cholecalciferol 125 MCG (5000 UT) TABS Take by mouth.     clonazePAM (KLONOPIN) 0.5 MG tablet Take 1 tablet (0.5 mg total) by mouth daily as needed for anxiety. 45 tablet 1   Eszopiclone 3 MG TABS Take 1 tablet (3 mg total) by mouth at bedtime. Take immediately before bedtime 90 tablet 1   gabapentin (NEURONTIN) 300 MG capsule Take 300 mg by mouth daily.     Icosapent Ethyl (VASCEPA) 1 g CAPS Take 2 capsules (2 g total) by mouth 2 (two) times daily. 120 capsule 3   levothyroxine (SYNTHROID) 50 MCG tablet TAKE 1 TABLET BY MOUTH DAILY (Patient taking differently: Take 50-75 mcg by mouth daily before breakfast. Mon-Friday 75 mcg and sat and Sunday 50 mcg.) 90 tablet 2   levothyroxine (SYNTHROID) 75 MCG tablet Take 1 tablet by mouth as directed. 1 tablet by mouth Monday through Friday     metoprolol succinate (TOPROL-XL) 25 MG 24 hr tablet TAKE 1 TABLET(25 MG) BY MOUTH DAILY 90 tablet 3   Multiple Vitamin (MULTI-VITAMINS) TABS Take by mouth.     QUEtiapine (SEROQUEL) 50 MG tablet TAKE 1 TABLET(50 MG) BY MOUTH AT BEDTIME 90 tablet 1   rosuvastatin (CRESTOR) 10 MG tablet TAKE 1 TABLET BY MOUTH EVERY DAY 90 tablet 0   Suvorexant ([name]) 20 MG TABS Take 1 tablet (20 mg total) by mouth at bedtime. 30 tablet 5   No facility-administered medications prior to visit.     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     PAST SURGICAL HISTORY: Past Surgical History:  Procedure Laterality Date   CARDIAC ELECTROPHYSIOLOGY STUDY AND ABLATION     KNEE SURGERY Bilateral    UMBILICAL HERNIA REPAIR       FAMILY HISTORY: Family History  Problem Relation Age of Onset   Atrial fibrillation Mother    Pneumonia Father    Cancer Father    Colon cancer Neg Hx    Esophageal cancer Neg Hx  Rectal cancer Neg Hx    Stomach cancer Neg Hx      SOCIAL HISTORY: Social History   Socioeconomic History   Marital status: Divorced    Spouse name: Not on file   Number of children: 2   Years of education: college   Highest education level: Not on file  Occupational History   Occupation: Art therapist    Comment: [employer]  Tobacco Use   Smoking status: Never   Smokeless tobacco: Never  Vaping Use   Vaping status: Never Used  Substance and Sexual Activity   Alcohol use: Yes    Comment: Social   Drug use: No   Sexual activity: Not on file  Other Topics Concern   Not on file  Social History Narrative   Patient is a Art therapist, works full time at [employer]. Patient has a college education. Patient is divorced.  Lives alone   Patient is right-handed.   Patient drinks very little caffeine (tea).   Social Determinants of Health   Financial Resource Strain: Not on file  Food Insecurity: No Food Insecurity (06/30/2021)   Received [facility], [facility]   Hunger Vital Sign    Worried About Running Out of Food in the Last Year: Never true    Ran Out of Food in the Last Year: Never true  Transportation Needs: Not on file  Physical Activity: Not on file  Stress: Not on file  Social Connections: Unknown (02/25/2022)   [facility], [facility]   Social Network    Social Network: Not on file  Intimate Partner Violence: Unknown (01/18/2022)   [facility] from [facility], [facility]   HITS    Physically Hurt: Not on file    Insult or Talk Down To: Not on file    Threaten Physical Harm: Not on file    Scream or Curse: Not on file     PHYSICAL EXAM  There were no vitals filed for this visit.  There is no height or weight on file to calculate BMI.  Generalized: Well developed, in no acute distress  Cardiology: normal rate and rhythm, no murmur auscultated  Respiratory: clear to auscultation bilaterally    Neurological examination  Mentation: Alert oriented to time, place, history taking. Follows all commands speech and language fluent Cranial nerve II-XII: Pupils were equal round reactive to light. Extraocular movements were full, visual field were full on confrontational test. Facial sensation and strength were normal. Head turning and shoulder shrug  were normal and symmetric. Motor: The motor testing reveals 5 over 5 strength of all 4 extremities. Good symmetric motor tone is noted throughout. No tremor noted on exam.  Gait and station: Gait is normal.   DIAGNOSTIC DATA (LABS, IMAGING, TESTING) - I reviewed patient records, labs, notes, testing and imaging myself where available.  Lab Results  Component Value Date   WBC 7.1 04/10/2019   HGB 14.0 04/10/2019   HCT 42.0 04/10/2019   MCV 89 04/10/2019   PLT 213 04/10/2019      Component Value Date/Time   NA 141 12/07/2021 1136   K 4.8 12/07/2021 1136   CL 102 12/07/2021 1136   CO2 26 12/07/2021 1136   GLUCOSE 105 (H) 12/07/2021 1136   BUN 15 12/07/2021 1136   CREATININE 0.98 12/07/2021 1136   CALCIUM 9.4 12/07/2021 1136   PROT 7.0 12/07/2021 1136   ALBUMIN 4.6 12/07/2021 1136   AST 17 12/07/2021 1136   ALT 14 12/07/2021 1136  ALKPHOS 51 12/07/2021 1136   BILITOT 1.0 12/07/2021 1136   GFRNONAA 67 04/10/2019 1124   GFRAA 77  04/10/2019 1124   Lab Results  Component Value Date   CHOL 143 04/10/2019   HDL 55 04/10/2019   LDLCALC 75 04/10/2019   TRIG 66 04/10/2019   CHOLHDL 2.6 04/10/2019   No results found for: "HGBA1C" No results found for: "VITAMINB12" Lab Results  Component Value Date   TSH 1.360 04/10/2019        No data to display               No data to display           ASSESSMENT AND PLAN  77 y.o. year old male  has a past medical history of Allergy, Arthritis, Cancer (HCC), CHF (congestive heart failure) (HCC), Diverticulosis, Dysfunctions associated with sleep stages or arousal from sleep, Hyperlipidemia, Hypothyroid, Pulmonary nodules, Right carpal tunnel syndrome (06/26/2018), Spondylolysis, Syncope and collapse, and Ulnar neuropathy at elbow (06/26/2018). here with    No diagnosis found.  Benjamin Maxwell is doing well on current regimen. He will continue quetiapine 50mg  daily. He will continue either Lunesta or [name] nightly but understands not to take both within 24 hours. He will continue clonazepam 0.25-0.5mg  as needed. PDMP shows appropriate refills. Meds due for refills were sent to pharmacy. Healthy lifestyle habits encouraged. He will follow up with PCP as directed. He will return to see Dr Vickey Huger in 6 months per her last note. He verbalizes understanding and agreement with this plan.   No orders of the defined types were placed in this encounter.    No orders of the defined types were placed in this encounter.    Shawnie Dapper, MSN, FNP-C 07/11/2023, 12:08 PM  [facility] [address] [place], [place] [address] [phone]

## 2023-07-11 NOTE — Patient Instructions (Signed)
Below is our plan: ? ?We will continue current treatment plan  ? ?Please make sure you are staying well hydrated. I recommend 50-60 ounces daily. Well balanced diet and regular exercise encouraged. Consistent sleep schedule with 6-8 hours recommended.  ? ?Please continue follow up with care team as directed.  ? ?Follow up with me in 6 months ? ?You may receive a survey regarding today's visit. I encourage you to leave honest feed back as I do use this information to improve patient care. Thank you for seeing me today!  ? ? ?

## 2023-07-12 ENCOUNTER — Ambulatory Visit: Payer: Medicare Other | Admitting: Family Medicine

## 2023-07-12 ENCOUNTER — Encounter: Payer: Self-pay | Admitting: Family Medicine

## 2023-07-12 VITALS — BP 123/73 | HR 65 | Ht 70.0 in | Wt 181.0 lb

## 2023-07-12 DIAGNOSIS — G4759 Other parasomnia: Secondary | ICD-10-CM | POA: Diagnosis not present

## 2023-07-12 DIAGNOSIS — G4701 Insomnia due to medical condition: Secondary | ICD-10-CM | POA: Diagnosis not present

## 2023-07-12 DIAGNOSIS — R251 Tremor, unspecified: Secondary | ICD-10-CM | POA: Diagnosis not present

## 2023-07-12 DIAGNOSIS — G4750 Parasomnia, unspecified: Secondary | ICD-10-CM | POA: Diagnosis not present

## 2023-07-12 MED ORDER — ESZOPICLONE 3 MG PO TABS
3.0000 mg | ORAL_TABLET | Freq: Every day | ORAL | 1 refills | Status: DC
Start: 2023-07-12 — End: 2024-01-30

## 2023-07-12 MED ORDER — QUETIAPINE FUMARATE 50 MG PO TABS
ORAL_TABLET | ORAL | 1 refills | Status: DC
Start: 2023-07-12 — End: 2024-01-22

## 2023-07-25 ENCOUNTER — Other Ambulatory Visit: Payer: Self-pay | Admitting: Neurology

## 2023-07-26 ENCOUNTER — Other Ambulatory Visit: Payer: Self-pay

## 2023-07-26 MED ORDER — BELSOMRA 20 MG PO TABS: 20.0000 mg | ORAL_TABLET | Freq: Every day | ORAL | 5 refills | Status: DC

## 2023-11-21 ENCOUNTER — Other Ambulatory Visit: Payer: Self-pay | Admitting: Neurology

## 2023-11-22 NOTE — Telephone Encounter (Signed)
Last seen on 07/12/23 Follow up scheduled on 01/30/24 Last filled on 09/27/23 #45 tablets  Rx pending to be signed

## 2023-12-08 ENCOUNTER — Telehealth: Payer: Self-pay | Admitting: Pharmacist

## 2023-12-08 ENCOUNTER — Telehealth: Payer: Self-pay | Admitting: Family Medicine

## 2023-12-08 ENCOUNTER — Other Ambulatory Visit (HOSPITAL_COMMUNITY): Payer: Self-pay

## 2023-12-08 NOTE — Telephone Encounter (Signed)
 Pt is needing a refill on his Eszopiclone 3 MG TABS and is needing it sent to the [facility] in [facility]

## 2023-12-08 NOTE — Telephone Encounter (Signed)
 Pharmacy Patient Advocate Encounter   Received notification from Patient Pharmacy that prior authorization for Eszopiclone 3MG  tablets is required/requested.   Insurance verification completed.   The patient is insured through [facility] .   Per test claim: PA required; PA submitted to above mentioned insurance via CoverMyMeds Key/confirmation #/EOC [id number] Status is pending

## 2023-12-08 NOTE — Telephone Encounter (Signed)
 Spoke to patient made him aware that Eszopiclone 3mg  is requiring an PA Approval . Informed patient PA was sent today . When we get approval from insurance company we will call him back Pt expressed understanding and thanked me for  calling

## 2023-12-08 NOTE — Telephone Encounter (Signed)
 Pharmacy Patient Advocate Encounter  Received notification from [facility] that Prior Authorization for Eszopiclone 3MG  tablets has been APPROVED from 12/08/2023 to 12/07/2024. Ran test claim, Copay is $23.26. This test claim was processed through [facility]- copay amounts may vary at other pharmacies due to pharmacy/plan contracts, or as the patient moves through the different stages of their insurance plan.   PA #/Case ID/Reference #: PA Case ID #: [id number]

## 2024-01-22 ENCOUNTER — Other Ambulatory Visit: Payer: Self-pay | Admitting: *Deleted

## 2024-01-22 DIAGNOSIS — G4701 Insomnia due to medical condition: Secondary | ICD-10-CM

## 2024-01-22 DIAGNOSIS — G4759 Other parasomnia: Secondary | ICD-10-CM

## 2024-01-22 DIAGNOSIS — G4750 Parasomnia, unspecified: Secondary | ICD-10-CM

## 2024-01-22 MED ORDER — QUETIAPINE FUMARATE 50 MG PO TABS: ORAL_TABLET | ORAL | 0 refills | Status: DC

## 2024-01-29 ENCOUNTER — Other Ambulatory Visit: Payer: Self-pay | Admitting: *Deleted

## 2024-01-29 DIAGNOSIS — G4750 Parasomnia, unspecified: Secondary | ICD-10-CM

## 2024-01-29 DIAGNOSIS — G4701 Insomnia due to medical condition: Secondary | ICD-10-CM

## 2024-01-29 DIAGNOSIS — G4759 Other parasomnia: Secondary | ICD-10-CM

## 2024-01-29 NOTE — Progress Notes (Unsigned)
 No chief complaint on file.   HISTORY OF PRESENT ILLNESS:  01/29/24 ALL:  Benjamin Maxwell returns for follow up for parasomnias and insomnia. He was last seen 06/2023 and doing well on clonazepam 0.5mg  PRN, quetiapine 50mg  at bedtime and Lunesta 3mg  or [name] 20mg  PRN.   Since,   Tremor?  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 He continues continues clonazepam 0.5 mg, quetiapine 50mg , and Lunesta 3mg  or [name] 20mg .  He reports doing well. He usually takes Lunesta and quetiapine nightly. If he has a bad night, he will take [name] the next. He takes clonazepam 12-15 times a month. He usually goes to be around 10p. Wakes around 7:30. Tremor is stable. He walks daily for exercise. 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 PDMP shows appropriate refills. No longer taking gabapentin post ablation. He is followed twice a year by PCP.   12/27/2022 CD: Benjamin Maxwell is a 78 y.o. male patient who is here for revisit 12/27/2022 for  follow up on parasomnia, insomnia, sleep walking.  Chief concern according to patient :  grip strength has recovered, right hand tremor has not progressed, he still travels. We are meeting for a refill he takes Lunesta nightly. If he does not sleep well, he will take [name] the following night. He usually takes clonazepam 1 tablet 12-15 times a month when having more parasomnias.  06/28/2022 ALL: Benjamin Maxwell is a 78 y.o. male here today for follow up for right hand tremor, insomnia and parasomnias. DaT scan was unremarkable. He continues clonazepam 0.5 mg, quetiapine 50mg , and Lunesta 3mg  or [name] 20mg . He reports that he alternated medications. He usually takes Lunesta nightly. If he does not sleep well, he will take [name] the following night. He usually takes clonazepam 1 tablet 12-15 times a month when having more parasomnias. Tremor is stable. No worsening neck pain or weakness. He is planning to have subcutaneous cyst  removed from right posterior neck net week with Dr Myrtis Ser, [employer]. He is status post bilateral knee replacements. He is having some sharp pains of right knee following but is well managed on gabapentin.   HISTORY (copied from Dr Dohmeier's previous note)  12-07-2021: Benjamin Maxwell , now 78 years old, presents after MRI neck and brain-  we had referred to NS, but his chosen Neurosurgeon has retired, the patient was not informed by [facility], and was not given any other physician option. We will sent him locally to Dr. Joesphine Bare team.  I will order a DAT scan for him to rule out Parkinsonism.  Tremor has improved with exercises. Grip strength too.    09-01-2020:  Benjamin Maxwell, a 78 year old gentleman with parasomnia has not developed tremors, more masked face and a slightly hoarse voice. REM BD is a known precursor for PD and we will check today for explicitly.    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    Benjamin Maxwell is a 78 y.o. male here as a referral from Dr. Christell Constant for follow up on Parasomnia, treated with Klonopin. Seen in [name] on 05-28-2018, just returned from [place] and [place], and is in good health he has a mild resting tremor noted. Not pill-rolling tremor, and present when he lifts a coffee cup, holds a pen etc. He has no hoarseness, no dysphagia,  and walks well on 2 "new knees ". PT was completed 3 weeks ago. He is climbing stairs well.    Parasomnia. Interval history from 05/23/2017, I have the pleasure of seeing Benjamin Maxwell, who has been a established patient with chronic insomnia. He has just returned from a trip to the [place]. He is in good health he continues to control sleep walking / parasomnias with Klonopin, and insomnia prn with Lunesta.   HPI: 05-19-2016. The patient has been followed here for almost 12 years. He is an active  traveler and has just traveled again through [place], [place] and [place].   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 As past medical history I need to add  that he had altitude sickness while (2013)  in [place], when he reached the 5000 m range. His  insomnia in high altitude was probably related to central apnea . The patient's bedtime is around 10 PM he arises at about 6:30 in the morning, he break spontaneously and does not need an alarm. We can speak days a fairly similar in sleep times he may have one bathroom break at night. There is no history of falls at night of dizziness or light headedness. He failed Doxepin and Ambien, needs to go back on Lunesta.    REVIEW OF SYSTEMS: Out of a complete 14 system review of symptoms, the patient complains only of the following symptoms, insomnia, parasomnias, depression, right knee pain, and all other reviewed systems are negative.   ALLERGIES: Allergies  Allergen Reactions   Levitra [Vardenafil]      HOME MEDICATIONS: Outpatient Medications Prior to Visit  Medication Sig Dispense Refill   Cholecalciferol 125 MCG (5000 UT) TABS Take by mouth.     clonazePAM (KLONOPIN) 0.5 MG tablet TAKE 1 TABLET(0.5 MG) BY MOUTH DAILY AS NEEDED FOR ANXIETY 45 tablet 5   Eszopiclone 3 MG TABS Take 1 tablet (3 mg total) by mouth at bedtime. Take immediately before bedtime 90 tablet 1   Icosapent Ethyl (VASCEPA) 1 g CAPS Take 2 capsules (2 g total) by mouth 2 (two) times daily. 120 capsule 3   levothyroxine (SYNTHROID) 50 MCG tablet TAKE 1 TABLET BY MOUTH DAILY (Patient taking differently: Take 50-75 mcg by mouth daily before breakfast. Mon-Friday 75 mcg and sat and Sunday 50 mcg.) 90 tablet 2   levothyroxine (SYNTHROID) 75 MCG tablet Take 1 tablet by mouth as directed. 1 tablet by mouth Monday through Friday     metoprolol succinate (TOPROL-XL) 25 MG 24 hr tablet TAKE 1 TABLET(25 MG) BY MOUTH DAILY 90 tablet 3   Multiple Vitamin  (MULTI-VITAMINS) TABS Take by mouth.     QUEtiapine (SEROQUEL) 50 MG tablet TAKE 1 TABLET(50 MG) BY MOUTH AT BEDTIME 30 tablet 0   rosuvastatin (CRESTOR) 10 MG tablet TAKE 1 TABLET BY MOUTH EVERY DAY 90 tablet 0   Suvorexant ([name]) 20 MG TABS Take 1 tablet (20 mg total) by mouth at bedtime. 30 tablet 5   No facility-administered medications prior to visit.     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     PAST SURGICAL HISTORY: Past Surgical History:  Procedure Laterality Date   CARDIAC ELECTROPHYSIOLOGY STUDY AND ABLATION     KNEE SURGERY Bilateral    UMBILICAL HERNIA REPAIR       FAMILY HISTORY: Family History  Problem Relation Age of Onset   Atrial fibrillation Mother    Pneumonia Father    Cancer Father    Colon cancer Neg Hx    Esophageal cancer Neg Hx    Rectal cancer Neg Hx    Stomach cancer Neg Hx      SOCIAL HISTORY: Social History   Socioeconomic History   Marital status: Divorced    Spouse name: Not on file   Number of children: 2   Years of education: college   Highest education level: Not on file  Occupational History   Occupation: Art therapist    Comment: [employer]  Tobacco Use   Smoking status: Never   Smokeless tobacco: Never  Vaping Use   Vaping status: Never Used  Substance and Sexual Activity   Alcohol use: Yes    Comment: Social   Drug use: No   Sexual activity: Not on file  Other Topics Concern   Not on file  Social History Narrative   Patient is a Art therapist, works full time at [employer]. Patient has a college  education. Patient is divorced.  Lives alone   Patient is right-handed.   Patient drinks very little caffeine (tea).   Social Drivers of Corporate investment banker Strain: Not on file  Food Insecurity: No Food Insecurity (06/30/2021)   Received from [facility], [facility]   Hunger Vital Sign    Worried About Running Out of Food in the Last Year: Never true    Ran Out of Food in the Last Year: Never true  Transportation Needs: Not on file  Physical Activity: Not on file  Stress: Not on file  Social Connections: Unknown (02/25/2022)   Received from [facility], [facility]   Social Network    Social Network: Not on file  Intimate Partner Violence: Unknown (01/18/2022)   Received from [facility], [facility]   HITS    Physically Hurt: Not on file    Insult or Talk Down To: Not on file    Threaten Physical Harm: Not on file    Scream or Curse: Not on file     PHYSICAL EXAM  There were no vitals filed for this visit.   There is no height or weight on file to calculate BMI.  Generalized: Well developed, in no acute distress  Cardiology: normal rate and rhythm, no murmur auscultated  Respiratory: clear to auscultation bilaterally    Neurological examination  Mentation: Alert oriented to time, place, history taking. Follows all commands speech and language fluent Cranial nerve II-XII: Pupils were equal round reactive to light. Extraocular movements were full, visual field were full on confrontational test. Facial sensation and strength were normal. Head turning and shoulder shrug  were normal and symmetric. Motor: The motor testing reveals 5 over 5 strength of all 4 extremities. Good symmetric motor tone is noted throughout. No tremor noted on exam.  Gait and station: Gait is normal.   DIAGNOSTIC DATA (LABS, IMAGING, TESTING) - I reviewed patient records, labs, notes, testing and imaging myself where available.  Lab Results  Component Value Date   WBC 7.1  04/10/2019   HGB 14.0  04/10/2019   HCT 42.0 04/10/2019   MCV 89 04/10/2019   PLT 213 04/10/2019      Component Value Date/Time   NA 141 12/07/2021 1136   K 4.8 12/07/2021 1136   CL 102 12/07/2021 1136   CO2 26 12/07/2021 1136   GLUCOSE 105 (H) 12/07/2021 1136   BUN 15 12/07/2021 1136   CREATININE 0.98 12/07/2021 1136   CALCIUM 9.4 12/07/2021 1136   PROT 7.0 12/07/2021 1136   ALBUMIN 4.6 12/07/2021 1136   AST 17 12/07/2021 1136   ALT 14 12/07/2021 1136   ALKPHOS 51 12/07/2021 1136   BILITOT 1.0 12/07/2021 1136   GFRNONAA 67 04/10/2019 1124   GFRAA 77 04/10/2019 1124   Lab Results  Component Value Date   CHOL 143 04/10/2019   HDL 55 04/10/2019   LDLCALC 75 04/10/2019   TRIG 66 04/10/2019   CHOLHDL 2.6 04/10/2019   No results found for: "HGBA1C" No results found for: "VITAMINB12" Lab Results  Component Value Date   TSH 1.360 04/10/2019        No data to display               No data to display           ASSESSMENT AND PLAN  78 y.o. year old male  has a past medical history of Allergy, Arthritis, Cancer (HCC), CHF (congestive heart failure) (HCC), Diverticulosis, Dysfunctions associated with sleep stages or arousal from sleep, Hyperlipidemia, Hypothyroid, Pulmonary nodules, Right carpal tunnel syndrome (06/26/2018), Spondylolysis, Syncope and collapse, and Ulnar neuropathy at elbow (06/26/2018). here with    No diagnosis found.  Benjamin Maxwell is doing well on current regimen. He will continue quetiapine 50mg  daily. He will continue either Lunesta or [name] nightly but understands not to take both within 24 hours. He will continue clonazepam 0.25-0.5mg  as needed. PDMP shows appropriate refills. Meds due for refills were sent to pharmacy. Healthy lifestyle habits encouraged. He will follow up with PCP as directed. He will return to see me in 6 months. He verbalizes understanding and agreement with this plan.   No orders of the defined types were placed in  this encounter.    No orders of the defined types were placed in this encounter.    Terrilyn Fick, MSN, FNP-C 01/29/2024, 12:58 PM  [facility] [address], [address] [place], [place] [address] [phone]

## 2024-01-29 NOTE — Patient Instructions (Signed)
 Below is our plan:  We will continue clonazepam 0.5mg  daily (try not to take if you don't have to), quetiapine daily, and EITHER Lunesta (Eszopiclone 3 MG TABS) OR Suvorexant ([name]) 20 MG TABS.   Please make sure you are staying well hydrated. I recommend 50-60 ounces daily. Well balanced diet and regular exercise encouraged. Consistent sleep schedule with 6-8 hours recommended.   Please continue follow up with care team as directed.   Follow up with Dr Albertina Hugger in 4-6 months   You may receive a survey regarding today's visit. I encourage you to leave honest feed back as I do use this information to improve patient care. Thank you for seeing me today!

## 2024-01-30 ENCOUNTER — Ambulatory Visit: Payer: Medicare Other | Admitting: Family Medicine

## 2024-01-30 ENCOUNTER — Encounter: Payer: Self-pay | Admitting: Family Medicine

## 2024-01-30 VITALS — BP 138/74 | HR 59 | Ht 71.0 in | Wt 194.0 lb

## 2024-01-30 DIAGNOSIS — R251 Tremor, unspecified: Secondary | ICD-10-CM | POA: Diagnosis not present

## 2024-01-30 DIAGNOSIS — G4701 Insomnia due to medical condition: Secondary | ICD-10-CM

## 2024-01-30 DIAGNOSIS — G4750 Parasomnia, unspecified: Secondary | ICD-10-CM | POA: Diagnosis not present

## 2024-01-30 DIAGNOSIS — G4759 Other parasomnia: Secondary | ICD-10-CM | POA: Diagnosis not present

## 2024-01-30 MED ORDER — ESZOPICLONE 3 MG PO TABS: 3.0000 mg | ORAL_TABLET | Freq: Every day | ORAL | 1 refills | Status: DC

## 2024-01-30 MED ORDER — BELSOMRA 20 MG PO TABS: 20.0000 mg | ORAL_TABLET | Freq: Every day | ORAL | 5 refills | Status: DC

## 2024-01-30 MED ORDER — QUETIAPINE FUMARATE 50 MG PO TABS: ORAL_TABLET | ORAL | 3 refills | Status: DC

## 2024-05-29 ENCOUNTER — Encounter: Payer: Self-pay | Admitting: Family Medicine

## 2024-05-29 MED ORDER — CLONAZEPAM 0.5 MG PO TABS: ORAL_TABLET | ORAL | 5 refills | Status: AC

## 2024-05-29 NOTE — Telephone Encounter (Signed)
 Last seen on 01/30/23 per note  We will continue clonazepam  0.5mg  daily (try not to take if you don't have to), quetiapine  daily, and EITHER Lunesta  (Eszopiclone  3 MG TABS) OR Suvorexant  ([name] ) 20 MG TABS.   Follow up scheduled on 07/16/24  I called [facility] to confirm no refills left   Dispensed Days Supply Quantity Provider Pharmacy  CLONAZEPAM  0.5MG  TABLETS 04/14/2024 45 45 each Dohmeier, Dedra, MD [id number]     Rx pending to be signed

## 2024-05-31 ENCOUNTER — Other Ambulatory Visit: Payer: Self-pay | Admitting: Neurology

## 2024-05-31 DIAGNOSIS — G4701 Insomnia due to medical condition: Secondary | ICD-10-CM

## 2024-05-31 DIAGNOSIS — G4750 Parasomnia, unspecified: Secondary | ICD-10-CM

## 2024-05-31 DIAGNOSIS — G4759 Other parasomnia: Secondary | ICD-10-CM

## 2024-07-16 ENCOUNTER — Encounter: Payer: Self-pay | Admitting: Neurology

## 2024-07-16 ENCOUNTER — Ambulatory Visit: Admitting: Neurology

## 2024-07-16 DIAGNOSIS — G4701 Insomnia due to medical condition: Secondary | ICD-10-CM

## 2024-07-16 DIAGNOSIS — I480 Paroxysmal atrial fibrillation: Secondary | ICD-10-CM | POA: Diagnosis not present

## 2024-07-16 DIAGNOSIS — G4759 Other parasomnia: Secondary | ICD-10-CM | POA: Diagnosis not present

## 2024-07-16 DIAGNOSIS — C61 Malignant neoplasm of prostate: Secondary | ICD-10-CM

## 2024-07-16 DIAGNOSIS — G4750 Parasomnia, unspecified: Secondary | ICD-10-CM | POA: Diagnosis not present

## 2024-07-16 MED ORDER — QUETIAPINE FUMARATE 50 MG PO TABS: ORAL_TABLET | ORAL | 3 refills | Status: AC

## 2024-07-16 MED ORDER — BELSOMRA 20 MG PO TABS: 20.0000 mg | ORAL_TABLET | Freq: Every day | ORAL | 5 refills | Status: AC

## 2024-07-16 MED ORDER — ESZOPICLONE 3 MG PO TABS: 3.0000 mg | ORAL_TABLET | Freq: Every day | ORAL | 1 refills | Status: AC

## 2024-07-16 NOTE — Patient Instructions (Signed)
    78 y.o. year old male here with:     1)  Parasomnia,  controlled on medication.    2) beginning cogwheeling over the left biceps.  A year ago , 07-2023, injury with an injection that injured the tendon , rotator cuff. Droopy shoulder,      Also slightly masked face but frequent blink.   No Memory concerns.   Prediabetic, reviewed  labs- normal renal and liver function.    Refill medications.  RV in 6 months virtual ,and in 12 months face to face.

## 2024-07-16 NOTE — Progress Notes (Signed)
 Provider:  Dedra Gores, MD  Primary Care Physician:  Marsa Bouchard, MD [phone] [place] [address]     Referring Provider: Marsa Bouchard, Md [address] [place],  [place] [address]          Chief Complaint according to patient   Patient presents with:                HISTORY OF PRESENT ILLNESS:  Benjamin Maxwell is a 78 y.o. male patient who is here for revisit 07/16/2024 for  parasomnia .  Pt alone, here for f/u of parasomnia. Pt states some days he has more stress than others so he is still having difficulty falling asleep. Pt states when he take the [name] , he doesn't take the clonzaepam or the eszopiclone . When he takes the clonzaepam and eszopiclone , he doesn't take the [name] .    Chief concern according to patient :  I am doing well. I sleep well . I still work.     Fam Hx : see previous note  Social HX; see previous note    Benjamin Maxwell is a 78 y.o. male patient who is here for revisit 12/27/2022 for  follow up on parasomnia, insomnia, sleep walking.  Chief concern according to patient :  grip strength has recovered, right hand tremor has not progressed, he still travels. We are meeting for a refill he takes Lunesta  nightly. If he does not sleep well, he will take [name]  the following night. He usually takes clonazepam  1 tablet 12-15 times a month when having more parasomnias.   Amy Lomax; 06/28/22 ALL:  Benjamin Maxwell is a 78 y.o. male here today for follow up for right hand tremor, insomnia and parasomnias. DaT scan was unremarkable. He continues clonazepam  0.5 mg, quetiapine  50mg , and Lunesta  3mg  or [name]  20mg . He reports that he alternated medications. He usually takes Lunesta  nightly. If he does not sleep well, he will take [name]  the following night. He usually takes clonazepam  1 tablet 12-15 times a month when having more parasomnias. Tremor is stable. No worsening neck pain or weakness. He is planning to have  subcutaneous cyst removed from right posterior neck net week with Dr Micky, [employer]. He is status post bilateral knee replacements. He is having some sharp pains of right knee following but is well managed on gabapentin .      12-07-2021: Rv with [name] , now 78 years old, presents after MRI neck and brain-  we had referred to NS, but his chosen Neurosurgeon has retired, the patient was not informed by [facility], and was not given any other physician option. We will sent him locally to Dr. Lear team.  I will order a DAT scan for him to rule out Parkinsonism.  Tremor has improved with exercises. Grip strength too.      09-01-2020: [name], a 78 year old gentleman with parasomnia has not developed tremors, more masked face and a slightly hoarse voice. REM BD is a known precursor for PD and we will check today for explicitly.      Review of Systems: Out of a complete 14 system review, the patient complains of only the following symptoms, and all other reviewed systems are negative.:   SLEEPINESS ?  How likely are you to doze in the following situations: 0 = not likely, 1 = slight chance, 2 = moderate chance, 3 = high chance  Sitting and Reading? Watching  Television? Sitting inactive in a public place (theater or meeting)? Lying down in the afternoon when circumstances permit? Sitting and talking to someone? Sitting quietly after lunch without alcohol? In a car, while stopped for a few minutes in traffic? As a passenger in a car for an hour without a break?  Total = 4/ 24  FSS 20/ 63        Social History   Socioeconomic History   Marital status: Divorced    Spouse name: Not on file   Number of children: 2   Years of education: college   Highest education level: Not on file  Occupational History   Occupation: Art therapist    Comment: [employer]  Tobacco Use   Smoking status: Never   Smokeless tobacco: Never  Vaping Use   Vaping status: Never Used   Substance and Sexual Activity   Alcohol use: Yes    Comment: Social   Drug use: No   Sexual activity: Not on file  Other Topics Concern   Not on file  Social History Narrative   Patient is a Art therapist, works full time at [employer]. Patient has a college education. Patient is divorced.  Lives alone   Patient is right-handed.   Patient drinks very little caffeine (tea).   Social Drivers of Corporate investment banker Strain: Not on file  Food Insecurity: No Food Insecurity (06/30/2021)   Received from [facility]   Hunger Vital Sign    Within the past 12 months, you worried that your food would run out before you got the money to buy more.: Never true    Within the past 12 months, the food you bought just didn't last and you didn't have money to get more.: Never true  Transportation Needs: Not on file  Physical Activity: Not on file  Stress: Not on file  Social Connections: Unknown (02/25/2022)   Received from [facility]   Social Network    Social Network: Not on file    Family History  Problem Relation Age of Onset   Atrial fibrillation Mother    Insomnia Mother    Pneumonia Father    Cancer Father    Colon cancer Neg Hx    Esophageal cancer Neg Hx    Rectal cancer Neg Hx    Stomach cancer Neg Hx    Parasomnia Neg Hx     Past Medical History:  Diagnosis Date   Allergy    Arthritis    Cancer (HCC)    Prostate, followed by urology   CHF (congestive heart failure) (HCC)    Diverticulosis    Dysfunctions associated with sleep stages or arousal from sleep    Hyperlipidemia    Hypothyroid    Pulmonary nodules    Right carpal tunnel syndrome 06/26/2018   Spondylolysis    Syncope and collapse    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     clonazePAM  (KLONOPIN ) 0.5 MG tablet TAKE 1 TABLET(0.5 MG) BY MOUTH DAILY AS NEEDED FOR ANXIETY 45 tablet 5   Eszopiclone  3 MG TABS Take 1 tablet (3 mg total) by mouth at bedtime. Take  immediately before bedtime 90 tablet 1   Icosapent  Ethyl (VASCEPA ) 1 g CAPS Take 2 capsules (2 g total) by mouth 2 (two) times daily. 120 capsule 3   levothyroxine  (SYNTHROID ) 50 MCG tablet TAKE 1 TABLET BY MOUTH DAILY (Patient taking differently: Take 50-75 mcg by mouth daily before breakfast. Sat and Sunday 50 mcg only) 90 tablet 2   levothyroxine  (SYNTHROID ) 75 MCG tablet Take 1 tablet by mouth as directed. 1 tablet by mouth Monday through Friday     metoprolol  succinate (TOPROL -XL) 25 MG 24 hr tablet TAKE 1 TABLET(25 MG) BY MOUTH DAILY 90 tablet 3   Multiple Vitamin (MULTI-VITAMINS) TABS Take by mouth.     QUEtiapine  (SEROQUEL ) 50 MG tablet TAKE 1 TABLET(50 MG) BY MOUTH AT BEDTIME 90 tablet 3   rosuvastatin  (CRESTOR ) 10 MG tablet TAKE 1 TABLET BY MOUTH EVERY DAY 90 tablet 0   sildenafil (REVATIO) 20 MG tablet Take 20 mg by mouth daily as needed. Take 1-5 tables as needed     Suvorexant  ([name] ) 20 MG TABS Take 1 tablet (20 mg total) by mouth at bedtime. 30 tablet 5   latanoprost (XALATAN) 0.005 % ophthalmic solution Place 1 drop into both eyes at bedtime.     No current facility-administered medications on file prior to visit.    Allergies  Allergen Reactions   Levitra [Vardenafil]      DIAGNOSTIC DATA (LABS, IMAGING, TESTING) - I reviewed patient records, labs, notes, testing and imaging myself where available.  Lab Results  Component Value Date   WBC 7.1 04/10/2019   HGB 14.0 04/10/2019   HCT 42.0 04/10/2019   MCV 89 04/10/2019   PLT 213 04/10/2019      Component Value Date/Time   NA 141 12/07/2021 1136   K 4.8 12/07/2021 1136   CL 102 12/07/2021 1136   CO2 26 12/07/2021 1136   GLUCOSE 105  (H) 12/07/2021 1136   BUN 15 12/07/2021 1136   CREATININE 0.98 12/07/2021 1136   CALCIUM  9.4 12/07/2021 1136   PROT 7.0 12/07/2021 1136   ALBUMIN 4.6 12/07/2021 1136   AST 17 12/07/2021 1136   ALT 14 12/07/2021 1136   ALKPHOS 51 12/07/2021 1136   BILITOT 1.0 12/07/2021 1136   GFRNONAA 67 04/10/2019 1124   GFRAA 77 04/10/2019 1124   Lab Results  Component Value Date   CHOL 143 04/10/2019   HDL 55 04/10/2019   LDLCALC 75 04/10/2019   TRIG 66 04/10/2019   CHOLHDL 2.6 04/10/2019   No results found for: HGBA1C No results found for: VITAMINB12 Lab Results  Component Value Date   TSH 1.360 04/10/2019    PHYSICAL EXAM:  Vitals:   07/16/24 0939  BP: (!) 140/80  Pulse: (!) 56   No data found. Body mass index is 30.56 kg/m.   Wt Readings from Last 3 Encounters:  07/16/24 213 lb (96.6 kg)  01/30/24 194 lb (88 kg)  07/12/23 181 lb (82.1 kg)     Ht Readings from Last 3 Encounters:  07/16/24 5' 10 (1.778 m)  01/30/24 5' 11 (1.803 m)  07/12/23 5' 10 (1.778 m)      General: The patient is awake, alert and appears not in acute distress and groomed. Head: Normocephalic, atraumatic.  Neck is supple. Normocephalic, atraumatic. Neck is supple.  Mallampati 2,  ,Cardiovascular:  Regular rate and cardiac rhythm by pulse,  without distended neck veins. Respiratory: Lungs are clear to auscultation.  Skin:  Without evidence of ankle edema, or rash. Trunk:  The patient's posture is erect.   NEUROLOGIC EXAM: The patient is awake and alert, oriented to place and time.   Memory subjective described as intact.  Attention span & concentration ability appears normal.  Speech is fluent,  without  dysarthria, dysphonia or aphasia.  Mood and affect are appropriate.   Cranial nerves: no loss of smell or taste reported  Pupils are equal and briskly reactive to light. Funduscopic exam deferred.  Extraocular movements in vertical and horizontal planes were intact and without  nystagmus. No Diplopia. Visual fields by finger perimetry are intact. Hearing was intact to soft voice and finger rubbing.    Facial sensation intact to fine touch.  Facial motor strength is symmetric and tongue and uvula move midline. No tremor of the tongue.  Neck ROM : rotation, tilt and flexion extension were normal for age and shoulder shrug was symmetrical.  Shoulders appear droopy.    Motor exam:  Symmetric bulk, tone and ROM.   Normal tone without cog-wheeling, symmetric grip strength .   Sensory:  Fine touch, pinprick and vibration were tested  and  normal.  Proprioception tested in the upper extremities was normal.   Coordination: Rapid alternating movements in the fingers/hands were of normal speed.  The Finger-to-nose maneuver was intact without evidence of ataxia, dysmetria or tremor.   Gait and station: Patient could rise unassisted from a seated position, walked without assistive device.  Stance is of normal width/ base . Toe and heel walk were deferred.   ASSESSMENT AND PLAN :   78 y.o. 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   2) beginning cogwheeling over the left biceps.  A year ago , 07-2023, injury with an injection that injured the tendon , rotator cuff. Droopy shoulder,   Also slightly masked face but frequent blink.  No Memory concerns.   Prediabetic, reviewed  labs- normal renal and liver function.   Refill medications.  RV in 6 months virtual ,and in 12 months face to face.    I would like to thank Marsa Bouchard, MD and Marsa Bouchard, Md [address] [place],  [place] [address] for allowing me to meet with this pleasant patient.   Sleep Clinic Patients are generally offered input on sleep hygiene, life style changes and how to improve compliance with medical treatment where applicable. Review and reiteration of good sleep hygiene measures is offered to any sleep clinic patient, be it in the first  consultation or with any follow up visits.    Any patient with sleepiness should be cautioned not to drive, work at heights, or operate dangerous or heavy equipment when feeling tired or sleepy.      The patient will be seen in follow-up in the sleep clinic at [facility] for discussion of test results, sleep related symptoms and treatment compliance review, further management strategies, etc.   The referring provider will be notified of the test results.   The patient's condition requires frequent monitoring and adjustments in the treatment plan, reflecting the ongoing complexity of care.  This provider is the continuing focal point for all needed services for this condition.  After spending a total time of  20  minutes face to face and time for  history taking, physical and neurologic examination, review of laboratory studies,  personal review of imaging studies, reports and results of other testing and review of referral information / records as far as provided in visit,  Electronically signed by: Dedra Gores, MD 07/16/2024 10:05 AM  [facility] Board certified by The [employer] of Sleep Medicine and Diplomate of the [employer] of Sleep Medicine. Board certified In Neurology through the ABPN, Fellow of the [employer] of Neu[employer].

## 2025-01-13 ENCOUNTER — Ambulatory Visit: Admitting: Neurology
